# Patient Record
Sex: Female | Born: 1975 | Hispanic: No | Marital: Married | Smoking: Never smoker
Health system: Southern US, Community
[De-identification: ages and names within clinical notes are randomized; demographics above are authoritative.]

---

## 2001-06-22 ENCOUNTER — Ambulatory Visit (HOSPITAL_COMMUNITY): Admission: RE | Admit: 2001-06-22 | Discharge: 2001-06-22 | Payer: Self-pay | Admitting: *Deleted

## 2001-10-12 ENCOUNTER — Ambulatory Visit (HOSPITAL_COMMUNITY): Admission: RE | Admit: 2001-10-12 | Discharge: 2001-10-12 | Payer: Self-pay | Admitting: *Deleted

## 2001-10-15 ENCOUNTER — Inpatient Hospital Stay (HOSPITAL_COMMUNITY): Admission: AD | Admit: 2001-10-15 | Discharge: 2001-10-18 | Payer: Self-pay | Admitting: *Deleted

## 2003-08-07 ENCOUNTER — Inpatient Hospital Stay (HOSPITAL_COMMUNITY): Admission: AD | Admit: 2003-08-07 | Discharge: 2003-08-09 | Payer: Self-pay

## 2010-02-18 ENCOUNTER — Ambulatory Visit: Payer: Self-pay | Admitting: Family Medicine

## 2010-02-18 ENCOUNTER — Encounter: Payer: Self-pay | Admitting: Family Medicine

## 2010-02-18 LAB — CONVERTED CEMR LAB
Antibody Screen: NEGATIVE
Basophils Absolute: 0 10*3/uL (ref 0.0–0.1)
Basophils Relative: 0 % (ref 0–1)
Eosinophils Absolute: 0.1 10*3/uL (ref 0.0–0.7)
Eosinophils Relative: 2 % (ref 0–5)
HCT: 33.8 % — ABNORMAL LOW (ref 36.0–46.0)
Hemoglobin: 11.1 g/dL — ABNORMAL LOW (ref 12.0–15.0)
Hepatitis B Surface Ag: NEGATIVE
Lymphocytes Relative: 27 % (ref 12–46)
Lymphs Abs: 1.7 10*3/uL (ref 0.7–4.0)
MCHC: 32.8 g/dL (ref 30.0–36.0)
MCV: 81.6 fL (ref 78.0–100.0)
Monocytes Absolute: 0.4 10*3/uL (ref 0.1–1.0)
Monocytes Relative: 7 % (ref 3–12)
Neutro Abs: 3.9 10*3/uL (ref 1.7–7.7)
Neutrophils Relative %: 63 % (ref 43–77)
Platelets: 257 10*3/uL (ref 150–400)
RBC: 4.14 M/uL (ref 3.87–5.11)
RDW: 15.4 % (ref 11.5–15.5)
Rh Type: POSITIVE
Rubella: 26.3 intl units/mL — ABNORMAL HIGH
Sickle Cell Screen: NEGATIVE
WBC: 6.1 10*3/uL (ref 4.0–10.5)

## 2010-02-19 ENCOUNTER — Encounter: Payer: Self-pay | Admitting: Family Medicine

## 2010-02-25 ENCOUNTER — Encounter: Payer: Self-pay | Admitting: Family Medicine

## 2010-02-25 ENCOUNTER — Ambulatory Visit: Payer: Self-pay | Admitting: Family Medicine

## 2010-02-25 LAB — CONVERTED CEMR LAB
Chlamydia, DNA Probe: NEGATIVE
GC Probe Amp, Genital: NEGATIVE
Pap Smear: NEGATIVE

## 2010-03-28 ENCOUNTER — Ambulatory Visit: Payer: Self-pay | Admitting: Family Medicine

## 2010-04-03 ENCOUNTER — Ambulatory Visit (HOSPITAL_COMMUNITY): Admission: RE | Admit: 2010-04-03 | Discharge: 2010-04-03 | Payer: Self-pay | Admitting: *Deleted

## 2010-04-03 ENCOUNTER — Encounter: Payer: Self-pay | Admitting: Family Medicine

## 2010-04-25 ENCOUNTER — Ambulatory Visit: Payer: Self-pay | Admitting: Family Medicine

## 2010-05-22 ENCOUNTER — Encounter: Payer: Self-pay | Admitting: Family Medicine

## 2010-05-23 ENCOUNTER — Ambulatory Visit: Payer: Self-pay | Admitting: Family Medicine

## 2010-06-21 ENCOUNTER — Encounter: Payer: Self-pay | Admitting: Family Medicine

## 2010-06-21 ENCOUNTER — Ambulatory Visit: Payer: Self-pay | Admitting: Family Medicine

## 2010-06-21 LAB — CONVERTED CEMR LAB
HCT: 33.1 % — ABNORMAL LOW (ref 36.0–46.0)
Hemoglobin: 11.2 g/dL — ABNORMAL LOW (ref 12.0–15.0)
MCHC: 33.8 g/dL (ref 30.0–36.0)
MCV: 93.5 fL (ref 78.0–100.0)
Platelets: 164 10*3/uL (ref 150–400)
RBC: 3.54 M/uL — ABNORMAL LOW (ref 3.87–5.11)
RDW: 13.6 % (ref 11.5–15.5)
WBC: 6.7 10*3/uL (ref 4.0–10.5)

## 2010-06-25 ENCOUNTER — Encounter: Payer: Self-pay | Admitting: Family Medicine

## 2010-07-01 ENCOUNTER — Ambulatory Visit: Payer: Self-pay | Admitting: Family Medicine

## 2010-07-01 ENCOUNTER — Encounter: Payer: Self-pay | Admitting: Family Medicine

## 2010-07-05 ENCOUNTER — Ambulatory Visit: Payer: Self-pay | Admitting: Family Medicine

## 2010-07-19 ENCOUNTER — Ambulatory Visit: Payer: Self-pay | Admitting: Family Medicine

## 2010-08-05 ENCOUNTER — Ambulatory Visit: Payer: Self-pay | Admitting: Family Medicine

## 2010-08-13 ENCOUNTER — Ambulatory Visit: Payer: Self-pay | Admitting: Family Medicine

## 2010-08-20 ENCOUNTER — Encounter: Payer: Self-pay | Admitting: Family Medicine

## 2010-08-20 ENCOUNTER — Ambulatory Visit: Payer: Self-pay | Admitting: Family Medicine

## 2010-08-29 ENCOUNTER — Ambulatory Visit: Payer: Self-pay | Admitting: Family Medicine

## 2010-09-05 ENCOUNTER — Ambulatory Visit: Payer: Self-pay | Admitting: Family Medicine

## 2010-09-07 ENCOUNTER — Inpatient Hospital Stay (HOSPITAL_COMMUNITY): Admission: AD | Admit: 2010-09-07 | Discharge: 2010-09-07 | Payer: Self-pay | Admitting: Obstetrics & Gynecology

## 2010-09-09 ENCOUNTER — Ambulatory Visit: Payer: Self-pay | Admitting: Obstetrics & Gynecology

## 2010-09-09 ENCOUNTER — Inpatient Hospital Stay (HOSPITAL_COMMUNITY): Admission: AD | Admit: 2010-09-09 | Discharge: 2010-09-11 | Payer: Self-pay | Admitting: Obstetrics & Gynecology

## 2011-01-21 NOTE — Assessment & Plan Note (Signed)
Summary: OB F/U [facility]   Vital Signs:  Patient profile:   35 year old female Height:      59 inches Weight:      129.9 pounds Temp:     98.2 degrees F oral Pulse rate:   87 / minute BP sitting:   111 / 72  Vitals Entered By: Tessie Fass CMA (March 28, 2010 1:37 PM) CC: OB visit Pain Assessment Patient in pain? no        CC:  OB visit.  Habits & Providers  Alcohol-Tobacco-Diet     Tobacco Status: never     Cigarette Packs/Day: n/a  Current Medications (verified): 1)  Prenatal/folic Acid  Tabs (Prenatal Vit-Fe Fumarate-Fa) .... Take One Daily  Allergies (verified): No Known Drug Allergies  Review of Systems  The patient denies anorexia, fever, weight loss, chest pain, abdominal pain, and severe indigestion/heartburn.     Impression & Recommendations:  Problem # 1:  SUPERVISION OF OTHER NORMAL PREGNANCY (ICD-V22.1) Assessment Unchanged OB here at 16 weeks.  G3P2. no more vomiting. Pt complaining of some seasonal allergies.  Advised benedryl and claritin (preg class B).  Scheduled anatomy scan. discussed genetic screen.  pt has decided not to get this.  rtc in 4 weeks.    Apos/neg, Hep BSAg neg, RPR neg, Rubella Immune,  HIV neg, Hgb 11.1,  GC/C neg/neg, Orders: Ultrasound (Ultrasound) Other OB visit- [facility] (OBCK)  Complete Medication List: 1)  Prenatal/folic Acid Tabs (Prenatal vit-fe fumarate-fa) .... Take one daily  Prenatal Visit Concerns noted: No problems, no more N/V  Patient Instructions: 1)  Please schedule a follow-up appointment in 1 month with OB clinic 2)  If you have vaginal bleeding, contractions that are consistent, or any other concerns, please go to the MAU or the Clinic.     OB Initial Intake Information    Positive HCG by: self    Race: Other    Marital status: Married    Occupation: homemaker    Education (last grade completed): 12th grade    Number of children at home: 2    [facility] delivery[facility]    Newborn's physician:  [facility]  FOB Information    Husband/Father of baby: Rudy Jew    FOB occupation factory    Phone: [phone]    FOB Comments: lives together.  father of other 2 children  Menstrual History    LMP (date): 12/05/2009    LMP - Character: normal    Menarche: 12 years    Menses interval: 28 days    Menstrual flow 4 days    On BCP's at conception: no  Flowsheet View for Follow-up Visit    Estimated weeks of       gestation:     16 1/7    Weight:     129.9    Blood pressure:   111 / 72    Headache:     No    Nausea/vomiting:   No    Edema:     0    Vaginal bleeding:   no    Vaginal discharge:   no    Fundal height:      15    FHR:       155    Labor symptoms:   no    Taking prenatal vits?   Y    Smoking:     n/a    Next visit:     4 wk    Resident:  Kristoff Coonradt    Preceptor:     moreno-coll

## 2011-01-21 NOTE — Assessment & Plan Note (Signed)
Summary: OB/KH   Vital Signs:  Patient profile:   35 year old female Weight:      146.8 pounds Pulse rate:   82 / minute BP sitting:   107 / 67  Vitals Entered By: Garen Grams LPN (August 05, 2010 1:39 PM)  Habits & Providers  Alcohol-Tobacco-Diet     Cigarette Packs/Day: n/a  Allergies: No Known Drug Allergies   Impression & Recommendations:  Problem # 1:  SUPERVISION OF OTHER NORMAL PREGNANCY (ICD-V22.1) Assessment Unchanged  Orders: Other OB visit- [facility] (OBCK)  pt is G3P2 at 34.5 weeks.  varicella immune.  passed 3 hour GTT.  wants IUD.  does not want depo.  going to BF.  children go to [facility].  Baby's name is Kristy Turner.  came in with husband and 2 other children today. RTC 1 week, next time will send to Ob clinic, will do GBS at [facility] clinic.  gav scholarship form for mirena/  Complete Medication List: 1)  Prenatal/folic Acid Tabs (Prenatal vit-fe fumarate-fa) .... Take one daily  Patient Instructions: 1)  regrese en una semana 2)  Si usted tiene un sangrado vaginal, las contracciones 4x/hour, o preocupaciones, por favor vaya a la hospital o la clnica. Dos veces al da evaluar si usted cree que el beb se mueve lo suficiente. Si no, entonces hacer recuento de patadas. Si se llega a cinco movimientos del beb dentro de una hora se puede detener. Si usted tiene menos de [employer], seguir contando con una hora ms. Si usted no recibe 10 movimientos en 2 horas, usted debe ir a la MAU o llame a la clnica   OB Initial Intake Information    Positive HCG by: self    Race: Other    Marital status: Married    Occupation: homemaker    Education (last grade completed): 12th grade    Number of children at home: 2    [facility] delivery[facility]    Newborn's physician: [facility]  FOB Information    Husband/Father of baby: Kristy Turner    FOB occupation factory    Phone: [phone]    FOB Comments: lives together.  father of other 2 children  Menstrual  History    LMP (date): 12/05/2009    LMP - Character: normal    Menarche: 12 years    Menses interval: 28 days    Menstrual flow 4 days    On BCP's at conception: no   Flowsheet View for Follow-up Visit    Estimated weeks of       gestation:     34 5/7    Weight:     146.8    Blood pressure:   107 / 67    Headache:     No    Nausea/vomiting:   No    Edema:     0    Vaginal bleeding:   no    Vaginal discharge:   no    Fundal height:      35    FHR:       135    Fetal activity:     yes    Labor symptoms:   no    Taking prenatal vits?   Y    Smoking:     n/a    Next visit:     1 wk    Resident:     Hulen Luster    Preceptor:     Mauricio Po  Prenatal Visit Concerns noted: none

## 2011-01-21 NOTE — Assessment & Plan Note (Signed)
Summary: OB 21 weeks   Vital Signs:  Patient profile:   35 year old female Weight:      133.3 pounds Pulse rate:   86 / minute BP sitting:   114 / 76  Vitals Entered By: Gladstone Pih (Apr 25, 2010 9:15 AM) CC: OB 20 1/7 Is Patient Diabetic? No Pain Assessment Patient in pain? no        CC:  OB 20 1/7.  Habits & Providers  Alcohol-Tobacco-Diet     Tobacco Status: never     Cigarette Packs/Day: n/a  Current Medications (verified): 1)  Prenatal/folic Acid  Tabs (Prenatal Vit-Fe Fumarate-Fa) .... Take One Daily  Allergies (verified): No Known Drug Allergies  Physical Exam  Extremities:  Small varices on legs   Impression & Recommendations:  Problem # 1:  SUPERVISION OF OTHER NORMAL PREGNANCY (ICD-V22.1)  Pt doing well.  Reviewed normal changes of pregnancy, including varicose veins.  May use compression stockings if she would like to.  Reviewed normal ultrasound results.  F/u 4 weeks Needs varicella vaccine postpartum.  Orders: Other OB visit- [facility] (OBCK)  Complete Medication List: 1)  Prenatal/folic Acid Tabs (Prenatal vit-fe fumarate-fa) .... Take one daily  Patient Instructions: 1)  Todo parece bien! 2)  La fecha de su parto es mas o menos Septiembre 21 3)  En caso que tiene [address], sangre de su vagina, o mucho flujo o otro preguntas, puede ir a l[facility] o puede Sports administrator. Prescriptions: PRENATAL/FOLIC ACID  TABS (PRENATAL VIT-FE FUMARATE-FA) take one daily  #100 x 3   Entered and Authorized by:   Sarah Swaziland MD   Signed by:   Sarah Swaziland MD on 04/25/2010   Method used:   Print then Give to Patient   RxID:   [id number]   Prenatal Visit Concerns noted: Noted varicose veins on legs.  Had with previous pregnancies, but more today.  No pain or itching. EDC Confirmation: Ultrasound Dating Information:    First U/S on 04/03/2010   Gest age: 18 and 1/7   EDC: 09/03/2010.   Flowsheet View for Follow-up Visit    Estimated  weeks of       gestation:     20 1/7    Weight:     133.3    Blood pressure:   114 / 76    Headache:     few    Nausea/vomiting:   No    Edema:     0    Vaginal bleeding:   no    Vaginal discharge:   no    Fundal height:      20    FHR:       140s    Fetal activity:     yes    Labor symptoms:   no    Taking prenatal vits?   Y    Smoking:     n/a    Next visit:     4 wk

## 2011-01-21 NOTE — Assessment & Plan Note (Signed)
Summary: OB/KH   Vital Signs:  Patient profile:   35 year old female Weight:      148.4 pounds Temp:     98.2 degrees F oral Pulse rate:   85 / minute Pulse rhythm:   regular BP sitting:   128 / 75  (left arm) Cuff size:   regular  Vitals Entered By: Loralee Pacas CMA (August 29, 2010 9:54 AM)  Habits & Providers  Alcohol-Tobacco-Diet     Cigarette Packs/Day: n/a  Allergies: No Known Drug Allergies   Impression & Recommendations:  Problem # 1:  SUPERVISION OF OTHER NORMAL PREGNANCY (ICD-V22.1)  Patient seen in OB clinic.  Doing well, no concerns.  Visit conducted in Spanish.  Denies HA, change in discharge, or contractions.  Feels ample fetal movement.  Discussed prior deliveries and confirmed dating of this pregnancy by [place] c/w LMP.  Plans to breastfeed.  Discussed kick counts and prenatal vitamins.  For followup in 1 week with primary physician.   Orders: Other OB visit- [facility] (OBCK)  Complete Medication List: 1)  Prenatal/folic Acid Tabs (Prenatal vit-fe fumarate-fa) .... Take one daily  Patient Instructions: 1)  Fue un placer verle hoy.  Todo parece estar bien con su bebe.  2)  Siga tomando las vitaminas antenatales.  Me alegro que quiere alimentar a su hijo con la [place].  3)  Por favor haga una cita con la Dra. Spiegel en [employer].  4)  OB FOLLOW UP WITH DR Hulen Luster IN 1 WEEK    Flowsheet View for Follow-up Visit    Estimated weeks of       gestation:     38 1/7    Weight:     148.4    Blood pressure:   128 / 75    Headache:     No    Nausea/vomiting:   No    Edema:     0    Vaginal bleeding:   no    Vaginal discharge:   no    Fundal height:      38    FHR:       125    Fetal activity:     yes    Labor symptoms:   no    Fetal position:     vertex    Taking prenatal vits?   Y    Smoking:     n/a    Next visit:     1 wk    Preceptor:     Mauricio Po    Comment:     plans to breastfeed    Flowsheet View for Follow-up Visit    Estimated weeks  of       gestation:     38 1/7    Weight:     148.4    Blood pressure:   128 / 75    Hx headache?     No    Nausea/vomiting?   No    Edema?     0    Bleeding?     no    Leakage/discharge?   no    Fetal activity:       yes    Labor symptoms?   no    Fundal height:      38    FHR:       125    Fetal position:      vertex    Taking Vitamins?   Y    Smoking  PPD:   n/a    Comment:     plans to breastfeed    Next visit:     1 wk    Preceptor:     Mauricio Po

## 2011-01-21 NOTE — Assessment & Plan Note (Signed)
Summary: NOB/AAM/DSL   Vital Signs:  Patient profile:   35 year old female LMP:     12/05/2009 Height:      59 inches Weight:      126 pounds Pulse rate:   82 / minute BP sitting:   111 / 71  Vitals Entered By: Jone Baseman CMA (February 25, 2010 3:24 PM) CC: NOB LMP (date): 12/05/2009 EDC by LMP==> 09/11/2010 EDC 09/11/2010 LMP - Character: normal LMP - Reliable? Yes Menarche (age onset years): 12   Menses interval (days): 28 Menstrual flow (days): 4 On BCP's at conception: no Enter LMP: 12/05/2009   CC:  NOB.  History of Present Illness: pt here for NOB.  no current complaints.  has been vomitting some but able to hold down food and folic acid  Habits & Providers  Alcohol-Tobacco-Diet     Alcohol drinks/day: 0     Tobacco Status: never     Cigarette Packs/Day: n/a  Current Medications (verified): 1)  Prenatal/folic Acid  Tabs (Prenatal Vit-Fe Fumarate-Fa) .... Take One Daily  Allergies (verified): No Known Drug Allergies  Past History:  Past Medical History: G3P2  2 NSVDs at [facility], full term  Social History: Education:  12th grade Hepatitis Risk:  no Smoking Status:  never Packs/Day:  n/a  Physical Exam  General:  alert, well-developed, well-nourished, and well-hydrated.   Head:  normocephalic and atraumatic.   Eyes:  vision grossly intact, pupils equal, pupils round, and pupils reactive to light.   Ears:  R ear normal and L ear normal.   Nose:  no external deformity.   Mouth:  good dentition and pharynx pink and moist.   Neck:  supple, full ROM, and no masses.   Lungs:  normal respiratory effort and normal breath sounds.   Heart:  normal rate and regular rhythm.   Abdomen:  soft, non-tender, and normal bowel sounds.   Genitalia:  Pelvic Exam:        External: normal female genitalia without lesions or masses        Vagina: normal without lesions or masses        Cervix: normal without lesions or masses        Adnexa: normal bimanual exam without  masses or fullness        Uterus: normal by palpation size correlates with dates of about 11 weeks        Pap smear: performed   Impression & Recommendations:  Problem # 1:  SUPERVISION OF OTHER NORMAL PREGNANCY (ICD-V22.1) Assessment New NOB here at 11 weeks.  G3P2.  discussed PNV, prescribed them.  Pt complaining of some seasonal allergies.  Advised benedryl and claritin (preg class B).  Pt will wait for anatomy scan since sure of date of LMP.  discussed genetic screen.  pt will discuss with husband and decide.  rtc in 4 weeks.   Orders: GC/Chlamydia-[facility] ([id number]) Pap Smear-[facility] ([id number]) Other OB visit- [facility] (OBCK)  Complete Medication List: 1)  Prenatal/folic Acid Tabs (Prenatal vit-fe fumarate-fa) .... Take one daily  Patient Instructions: 1)  It was nice to meet you! 2)  please come back in 1 month 3)  Call our office if you would like to schedule an early genetic screen, or if you would like the later screen, we can schedule it at the next appt. 4)  Claritin and benedryl are ok to take for allergies. 5)  If you have bleeding or cramping more than slight spotting, call the office or go to  the women's hospital 6)  Fue un placer conocerte! 7)   por favor regrese en un mes 8)   Llame a nuestra oficina si usted quisiera programar una pantalla gentica temprana, o si desea que la pantalla despus, podemos programar para la prxima cita. 9)   Claritin y Benadryl se puede tomar para las [place]. 10)   Si usted tiene sangrado o calambres ms ligero [employer], llame a la oficina o vaya al hospital de las mujeres Prescriptions: PRENATAL/FOLIC ACID  TABS (PRENATAL VIT-FE FUMARATE-FA) take one daily  #30 x 8   Entered and Authorized by:   Ellery Plunk MD   Signed by:   Ellery Plunk MD on 02/25/2010   Method used:   Electronically to        [facility] [address] [phone]* (retail)       [address] [place], [place]  [address]       Ph: [phone]       Fax:  [phone]   RxID:   [id number]   Prenatal Visit    FOB name: Rudy Jew Concerns noted: no bleeding no discharge, no cramping EDC Confirmation:    New working [facility]: 09/11/2010    LMP reliable? Yes    Last menses onset (LMP) date: 12/05/2009    EDC by LMP: 09/11/2010   Flowsheet View for Follow-up Visit    Estimated weeks of       gestation:     11 5/7    Weight:     126    Blood pressure:   111 / 71    Headache:     No    Nausea/vomiting:   vom1/d    Edema:     0    Vaginal bleeding:   no    Vaginal discharge:   no    Fundal height:      unable to determine    FHR:       140    Labor symptoms:   no    Fetal position:     N/A    Cx Dilation:     0    Cx Effacement:   0%    Cx Station:     high    Taking prenatal vits?   Y    Smoking:     n/a    Next visit:     4 wk    Resident:     Roza Creamer    Preceptor:     hensel   OB Initial Intake Information    Positive HCG by: self    Race: Other    Marital status: Married    Occupation: homemaker    Education (last grade completed): 12th grade    Number of children at home: 2    [facility] delivery[facility]    Newborn's physician: [facility]  FOB Information    Husband/Father of baby: Rudy Jew    FOB occupation factory    Phone: [phone]    FOB Comments: lives together.  father of other 2 children  Menstrual History    LMP (date): 12/05/2009    EDC by LMP: 09/11/2010    Best Working EDC: 09/11/2010    LMP - Character: normal    LMP - Reliable? : Yes    Menarche: 12 years    Menses interval: 28 days    Menstrual flow 4 days    On BCP's at conception: no    Pre  Pregnancy Weight: 135 lbs.    Symptoms since LMP: nausea, vomiting  Prenatal Visit    FOB name: Rudy Jew [facility] Confirmation:    New working [facility]: 09/11/2010    LMP reliable? Yes    Last menses onset (LMP) date: 12/05/2009    EDC by LMP: 09/11/2010   Past Pregnancy History    Gravida:     3    Term Births:     2    Living  Children:   2    Para:       2  Pregnancy # 1    Delivery date:     10/16/2001    Weeks Gestation:   41-42    Preterm labor:     no    Delivery type:     NSVD    Hours of labor:     12    Anesthesia type:     epidural    Delivery location:     [facility]    Infant Sex:     Female    Birth weight:     8.5    Name:     Percell Belt  Pregnancy # 2    Delivery date:     08/07/2003    Weeks Gestation:   40    Preterm labor:     no    Delivery type:     NSVD    Hours of labor:     6    Anesthesia type:     nothing    Delivery location:     [facility]    Infant Sex:     Female    Birth weight:     8    Name:     Gissele  Pregnancy # 3    Comments:     current   Genetic History    Father of baby:   Jesus Jayme Cloud     Thalassemia:     mother: no   father: no    Neural tube defect:   mother: no   father: no    Down's Syndrome:   mother: no   father: no    Tay-Sachs:     mother: no   father: no    Sickle Cell Dz/Trait:   mother: no   father: no    Hemophilia:     mother: no   father: no    Muscular Dystrophy:   mother: no   father: no    Cystic Fibrosis:   mother: no   father: no    Huntington's Dz:   mother: no   father: no    Mental Retardation:   mother: no   father: no    Fragile X:     mother: no   father: no    Other Genetic or       Chromosomal Dz:   mother: no   father: no    Child with other       birth defect:     mother: no   father: no    > 3 spont. abortions:   mother: no    Hx of stillbirth:     mother: no  Genetic Counseling:    pt will discuss with husband to decide.  Infection Risk History    Infection Risk History Reviewed:    High Risk Hepatitis B: no    Immunized against Hepatitis B: yes    Exposure to TB: no    Patient with  history of Genital Herpes: no    Sexual partner with history of Genital Herpes: no    History of STD (GC, Chlamydia, Syphilis, HPV): no    Rash, Viral, or Febrile Illness since LMP: no    Exposure to Cat Litter: no    Chicken Pox Immune Status:  Non-immune    History of Parvovirus (Fifth Disease): no    Occupational Exposure to Children: none  Environmental Exposures    Environmental Exposures Reviewed:    Xray Exposure since LMP: no    Chemical or other exposure: no    Medication, drug, or alcohol use since LMP: no

## 2011-01-21 NOTE — Miscellaneous (Signed)
Summary: immune to varicella  Clinical Lists Changes  Problems: Removed problem of * VARICELLA NON-IMMUNE    pt is immune to varicella per titer  Ellery Plunk MD  June 25, 2010 3:26 PM

## 2011-01-21 NOTE — Miscellaneous (Signed)
  Clinical Lists Changes 

## 2011-01-21 NOTE — Assessment & Plan Note (Signed)
Summary: OB visit/spiegel pt/pcp not avail/eo   Vital Signs:  Patient profile:   35 year old female Weight:      147.6 pounds BP sitting:   110 / 75  Habits & Providers  Alcohol-Tobacco-Diet     Cigarette Packs/Day: n/a  Allergies: No Known Drug Allergies   Impression & Recommendations:  Problem # 1:  SUPERVISION OF OTHER NORMAL PREGNANCY (ICD-V22.1) Assessment Comment Only  No problems today.  GBS negative. Follow up with PCP next week.   pt is G3P2 at 39.1  weeks.  varicella immune.  passed 3 hour GTT.  wants IUD.  does not want depo.  going to BF.  children go to [employer] [facility] [employer] [facility].  Baby's name is Kristy Turner.  came in with husband today. RTC 1 week    Orders: Other OB visit- [facility] (OBCK)  Complete Medication List: 1)  Prenatal/folic Acid Tabs (Prenatal vit-fe fumarate-fa) .... Take one daily  Patient Instructions: 1)  Fue un placer verle hoy.  Todo parece estar bien con su bebe.  2)  Siga tomando las vitaminas antenatales.  Me alegro que quiere alimentar a su hijo con la [place].  3)  Por favor haga una cita con la Dra. Spiegel en [employer].  4)   FOLLOW UP WITH DR SPIEGEL IN 1 WEEK   OB Initial Intake Information    Positive HCG by: self    Race: Other    Marital status: Married    Occupation: homemaker    Education (last grade completed): 12th grade    Number of children at home: 2    [facility]ospital [facility] delivery: [facility]    Newborn's physician: [facility]  FOB Information    Husband/Father of baby: Rudy Jew    FOB occupation factory    Phone: [phone]    FOB Comments: lives together.  father of other 2 children  Menstrual History    LMP (date): 12/05/2009    LMP - Character: normal    Menarche: 12 years    Menses interval: 28 days    Menstrual flow 4 days    On BCP's at conception: no   Flowsheet View for Follow-up Visit    Estimated weeks of       gestation:     39 1/7    Weight:     147.6    Blood pressure:   110 / 75    Headache:     No  Nausea/vomiting:   No    Edema:     0    Vaginal bleeding:   no    Vaginal discharge:   no    Fundal height:      39    FHR:       135    Fetal activity:     yes    Labor symptoms:   no    Fetal position:     vertex by Thayer Ohm    Taking prenatal vits?   Y    Smoking:     n/a    Next visit:     1 wk    Resident:     Wallene Huh    Preceptor:     sent to [facility]

## 2011-01-21 NOTE — Assessment & Plan Note (Signed)
Summary: ob visit/eo   Vital Signs:  Patient profile:   35 year old female Height:      59 inches Weight:      147.3 pounds Pulse rate:   73 / minute BP sitting:   115 / 75  Vitals Entered By: Garen Grams LPN (August 13, 2010 2:01 PM)  Habits & Providers  Alcohol-Tobacco-Diet     Cigarette Packs/Day: n/a  Allergies: No Known Drug Allergies   Impression & Recommendations:  Problem # 1:  SUPERVISION OF OTHER NORMAL PREGNANCY (ICD-V22.1) Assessment Unchanged no problems today.  GBS deferred to [facility] clinic as discussed last week.   Orders: Other OB visit- [facility] (OBCK)  Orders: Other OB visit- [facility] (OBCK)  pt is G3P2 at 35.6  weeks.  varicella immune.  passed 3 hour GTT.  wants IUD.  does not want depo.  going to BF.  children go to [facility].  Baby's name is [name].  came in with husband and 2 other children today. RTC 1 week  gave scholarship form for mirena/  Complete Medication List: 1)  Prenatal/folic Acid Tabs (Prenatal vit-fe fumarate-fa) .... Take one daily  Patient Instructions: 1)  return in one week for OB clinic 2)  Si usted tiene un sangrado vaginal, las contracciones 4x/hour, o preocupaciones, por favor vaya a la hospital de mujeres o la clnica. Dos veces al da evaluar si usted cree que el beb se mueve lo suficiente. Si no, entonces hacer recuento de patadas. Si se llega a cinco movimientos del beb dentro de una hora se puede detener. Si usted tiene menos de [employer], seguir contando con una hora ms. 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  father of other 2 children  Menstrual History    LMP (date): 12/05/2009    LMP - Character: normal    Menarche: 12 years    Menses interval: 28 days    Menstrual flow 4 days    On BCP's at conception: no   Flowsheet View for Follow-up Visit    Estimated weeks of       gestation:     35 6/7    Weight:     147.3    Blood pressure:   115 / 75    Headache:     No    Nausea/vomiting:   No    Edema:     0    Vaginal bleeding:   no    Vaginal discharge:   no    Fundal height:      36    FHR:       140    Fetal activity:     yes    Labor symptoms:   no    Taking prenatal vits?   Y    Smoking:     n/a    Next visit:     1 wk    Resident:  Geraldine Sandberg    Preceptor:     Jennette Kettle

## 2011-01-21 NOTE — Assessment & Plan Note (Signed)
Summary: 8:15 OB F/U PER DR SPEIGEL/[facility]   Vital Signs:  Patient profile:   35 year old female Weight:      142 pounds BP sitting:   114 / 72  Vitals Entered By: Jimmy Footman, CMA (July 19, 2010 8:36 AM) CC: ob f/u Is Patient Diabetic? No Pain Assessment Patient in pain? no        CC:  ob f/u.  Habits & Providers  Alcohol-Tobacco-Diet     Cigarette Packs/Day: n/a  Allergies: No Known Drug Allergies   Impression & Recommendations:  Problem # 1:  SUPERVISION OF OTHER NORMAL PREGNANCY (ICD-V22.1) Assessment Unchanged  pt is G3P2 at 32.2 weeks.  varicella immune.  passed 3 hour GTT.  wants IUD.  does not want depo.  going to BF.  children go to [facility].  Baby's name is Kristy Turner.  came in with husband and 2 other children today. RTC 2 weeks Orders: Other OB visit- [facility] ([facility]K)  Complete Medication List: 1)  Prenatal/folic Acid Tabs (Prenatal vit-fe fumarate-fa) .... Take one daily  Patient Instructions: 1)  regrese a la clinica in 2 semanas 2)  Si usted tiene un sangrado vaginal, las contracciones 4x/hour, o preocupaciones, por favor vaya a la hospital [facility] o la [place].  Prenatal Visit Concerns noted: none today   Flowsheet View for Follow-up Visit    Estimated weeks of       gestation:     32 2/7    Weight:     142    Blood pressure:   114 / 72    Hx headache?     No    Nausea/vomiting?   No    Edema?     0    Bleeding?     no    Leakage/discharge?   no    Fetal activity:       yes    Labor symptoms?   no    Fundal height:      32    FHR:       135    Taking Vitamins?   Y    Smoking PPD:   n/a    Next visit:     2 wk    Resident:     Hulen Luster    Preceptor:     Deirdre Priest

## 2011-01-21 NOTE — Assessment & Plan Note (Signed)
Summary: ob visit/eo   Vital Signs:  Patient profile:   35 year old female Weight:      140.6 pounds Pulse rate:   111 / minute BP sitting:   106 / 75  Vitals Entered By: Garen Grams LPN (July 05, 2010 2:57 PM)  Habits & Providers  Alcohol-Tobacco-Diet     Cigarette Packs/Day: n/a  Allergies: No Known Drug Allergies   Impression & Recommendations:  Problem # 1:  SUPERVISION OF OTHER NORMAL PREGNANCY (ICD-V22.1) Assessment Unchanged pt is G3P2 at 30.2 weeks.  varicella immune.  passed 3 hour GTT.  wants IUD.  does not want depo.  gave form for scholarship today.  going to BF.  children go to [facility].  Baby's name is [name]. Orders: Other OB visit- [facility] ([facility]K)  Complete Medication List: 1)  Prenatal/folic Acid Tabs (Prenatal vit-fe fumarate-fa) .... Take one daily  Patient Instructions: 1)  regrese a la clinica in 2 semanas 2)  Si usted tiene un sangrado vaginal, las contracciones 4x/hour, o preocupaciones, por favor vaya a la [facility] o la [place].   OB Initial Intake Information    Positive HCG by: self    Race: Other    Marital status: Married    Occupation: homemaker    Education (last grade completed): 12th grade    Number of children at home: 2    [facility]ospital of delivery: [facility]    Newborn's physician: [facility]  FOB Information    Husband/Father of baby: Rudy Jew    FOB occupation factory    Phone: [phone]    FOB Comments: lives together.  father of other 2 children  Menstrual History    LMP (date): 12/05/2009    LMP - Character: normal    Menarche: 12 years    Menses interval: 28 days    Menstrual flow 4 days    On BCP's at conception: no   Flowsheet View for Follow-up Visit    Estimated weeks of       gestation:     30 2/7    Weight:     140.6    Blood pressure:   106 / 75    Headache:     No    Nausea/vomiting:   No    Edema:     0    Vaginal bleeding:   no    Vaginal discharge:   no    Fundal height:      30  FHR:       140    Fetal activity:     yes    Labor symptoms:   few ctx    Taking prenatal vits?   Y    Smoking:     n/a    Next visit:     2 wk    Resident:     Hulen Luster    Preceptor:     Chambliss  Prenatal Visit Concerns noted: none

## 2011-01-21 NOTE — Assessment & Plan Note (Signed)
Summary: OB/KH   Vital Signs:  Patient profile:   35 year old female Weight:      147.3 pounds Pulse rate:   67 / minute BP sitting:   111 / 73  Vitals Entered By: Garen Grams LPN (August 20, 2010 2:24 PM)  Habits & Providers  Alcohol-Tobacco-Diet     Cigarette Packs/Day: n/a  Allergies: No Known Drug Allergies   Impression & Recommendations:  Problem # 1:  SUPERVISION OF OTHER NORMAL PREGNANCY (ICD-V22.1) Assessment Unchanged  no problems today.  GBS done today.  will f/u with OB clinic next week.   pt is G3P2 at 36.6  weeks.  varicella immune.  passed 3 hour GTT.  wants IUD.  does not want depo.  going to BF.  children go to [facility].  Baby's name is Kristy Turner.  came in with husband today. RTC 1 week    Complete Medication List: 1)  Prenatal/folic Acid Tabs (Prenatal vit-fe fumarate-fa) .... Take one daily  Other Orders: Grp B Probe-[facility] ([id number]) Other OB visit- [facility])  Patient Instructions: 1)  come back in one week as scheduled. 2)  regrese en 1 semana 3)  Si usted tiene un sangrado vaginal, las contracciones 4x/hour, o preocupaciones, por favor vaya a la MAU o la clnica. Dos veces al da evaluar si usted cree que el beb se mueve lo suficiente. Si no, entonces hacer recuento de patadas. Si se llega a cinco movimientos del beb dentro de una hora se puede detener. Si usted tiene menos de [place], seguir contando con una hora ms. Si usted no recibe 10 movimientos en 2 horas, usted debe ir a la MAU o llame a la clnica   OB Initial Intake Information    Positive HCG by: self    Race: Other    Marital status: Married    Occupation: homemaker    Education (last grade completed): 12th grade    Number of children at home: 2    [facility]ospital of delivery: [facility]    Newborn's physician: [facility]  FOB Information    Husband/Father of baby: Rudy Jew    FOB occupation factory    Phone: [phone]    FOB Comments: lives together.  father of  other 2 children  Menstrual History    LMP (date): 12/05/2009    LMP - Character: normal    Menarche: 12 years    Menses interval: 28 days    Menstrual flow 4 days    On BCP's at conception: no   Flowsheet View for Follow-up Visit    Estimated weeks of       gestation:     36 6/7    Weight:     147.3    Blood pressure:   111 / 73    Headache:     No    Nausea/vomiting:   No    Edema:     0    Vaginal bleeding:   no    Vaginal discharge:   no    Fundal height:      37    FHR:       140    Fetal activity:     yes    Labor symptoms:   no    Taking prenatal vits?   Y    Smoking:     n/a    Next visit:     1 wk    Resident:     Hulen Luster    Preceptor:  chambliss

## 2011-01-21 NOTE — Assessment & Plan Note (Signed)
Summary: ob/tlb   Vital Signs:  Patient profile:   35 year old female Height:      59 inches Weight:      140 pounds BP sitting:   107 / 72  Vitals Entered By: Jone Baseman CMA (June 21, 2010 1:53 PM) CC: OB   CC:  OB.  Habits & Providers  Alcohol-Tobacco-Diet     Cigarette Packs/Day: n/a  Allergies: No Known Drug Allergies   Impression & Recommendations:  Problem # 1:  SUPERVISION OF OTHER NORMAL PREGNANCY (ICD-V22.1) Assessment Unchanged 28 weeks today.  checked varicella with 28 week labs.  pt to return for 3 hour GTT.  no concerns today.  pt will discuss BC with husband and come back next time with questions. Orders: Glucose 1 hr-[facility] (82950) CBC-[facility] (14782) RPR-[facility] ([id number]) Miscellaneous Lab Charge-[facility] (78469) HIV-[facility] ([id number]) Other OB visit- [facility] (OBCK)  Complete Medication List: 1)  Prenatal/folic Acid Tabs (Prenatal vit-fe fumarate-fa) .... Take one daily  Patient Instructions: 1)  please come back in 2 weeks 2)  regrese a la clinica in 2 semanas 3)  Si usted tiene un sangrado vaginal, las contracciones 4x/hour, o preocupaciones, por favor vaya a la hospital [facility] o la clnica.   Prenatal Visit Concerns noted: no concerns today   Flowsheet View for Follow-up Visit    Estimated weeks of       gestation:     28 2/7    Weight:     140    Blood pressure:   107 / 72    Headache:     No    Nausea/vomiting:   No    Edema:     0    Vaginal bleeding:   no    Vaginal discharge:   no    Fundal height:      29    FHR:       135    Fetal activity:     yes    Taking prenatal vits?   Y    Smoking:     n/a    Next visit:     2 wk    Resident:     Hulen Luster    Preceptor:     Deirdre Priest

## 2011-01-21 NOTE — Assessment & Plan Note (Signed)
Summary: ob visit/eo   Vital Signs:  Patient profile:   35 year old female Weight:      137 pounds BP sitting:   105 / 67  Vitals Entered By: Jone Baseman CMA (May 23, 2010 2:15 PM)  Habits & Providers  Alcohol-Tobacco-Diet     Cigarette Packs/Day: n/a  Allergies: No Known Drug Allergies   Impression & Recommendations:  Problem # 1:  SUPERVISION OF OTHER NORMAL PREGNANCY (ICD-V22.1) Assessment Unchanged  will need varicella titers at 28 week labs.  Will RTC in 4 weeks.  need to discuss birth control, child birth classes at that visit. Apos/neg, Hep BSAg neg, RPR neg, Rubella Immune,  HIV neg, Hgb 11.1,  GC/C neg/neg,  Orders: Other OB visit- [facility] (OBCK)  Complete Medication List: 1)  Prenatal/folic Acid Tabs (Prenatal vit-fe fumarate-fa) .... Take one daily  Patient Instructions: 1)  If you have vaginal bleeding, contractions that are consistent, or any other concerns, please go to the MAU or the Clinic.   2)  Think about what you would like to do for contraception so we can discuss your options at some point in the future.   3)  It is wonderful that you are going to breastfeed!  THis is great for baby. 4)  See you in one month.  At that visit we will check your bloodwork again. 5)  Si tiene un sangrado vaginal, las contracciones que son coherentes, o cualquier otra inquietud, por favor vaya a la MAU o la clnica. 6)   Piense en lo que les gustara hacer para la anticoncepcin para que podamos hablar de sus opciones en algn momento en el futuro. 7)   Es [employer] que usted va a Museum/gallery exhibitions officer! Esto es muy bueno para el beb. 8)   Nos vemos en un mes. En esa consulta vamos a ver sus anlisis de [employer].  Prenatal Visit Concerns noted: no concerns today   Flowsheet View for Follow-up Visit    Estimated weeks of       gestation:     24 1/7    Weight:     137    Blood pressure:   105 / 67    Headache:     No    Nausea/vomiting:   No    Edema:     0  Vaginal bleeding:   no    Vaginal discharge:   no    Fundal height:      23.5    FHR:       140    Fetal activity:     yes    Taking prenatal vits?   Y    Smoking:     n/a    Next visit:     4 wk    Resident:     Hulen Luster    Preceptor:     Mauricio Po

## 2011-01-26 IMAGING — US US OB DETAIL+14 WK
2 series · 14 of 28 positions shown · non-contrast
Comparison: none

OBSTETRICAL ULTRASOUND:
 This ultrasound exam was performed in the [HOSPITAL] Ultrasound Department.  The OB US report was generated in the AS system, and faxed to the ordering physician.  This report is also available in [HOSPITAL]?s Access[employer] and in [REDACTED] PACS.

[Series 1: us ob detail +14 wk · 0.21mm/px · 1 of 4 slices shown (1 of 2)]
[im 4/4]
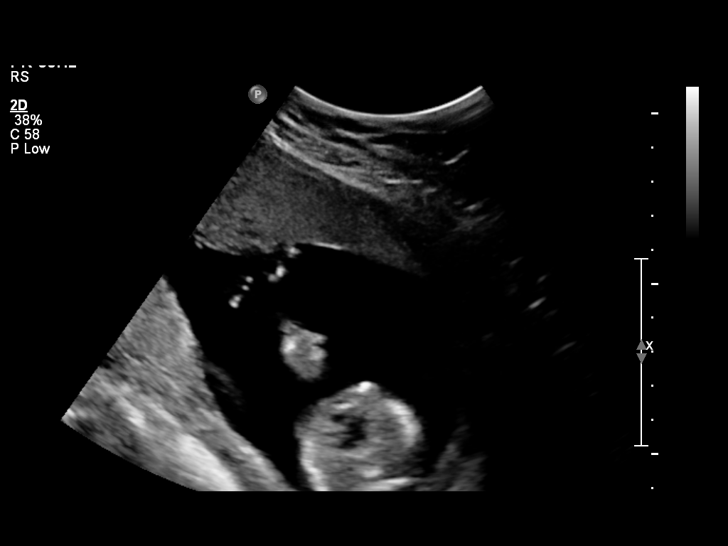

[Series 1: us ob detail +14 wk · 13 of 67 slices shown (2 of 2)]
[im 3/67]
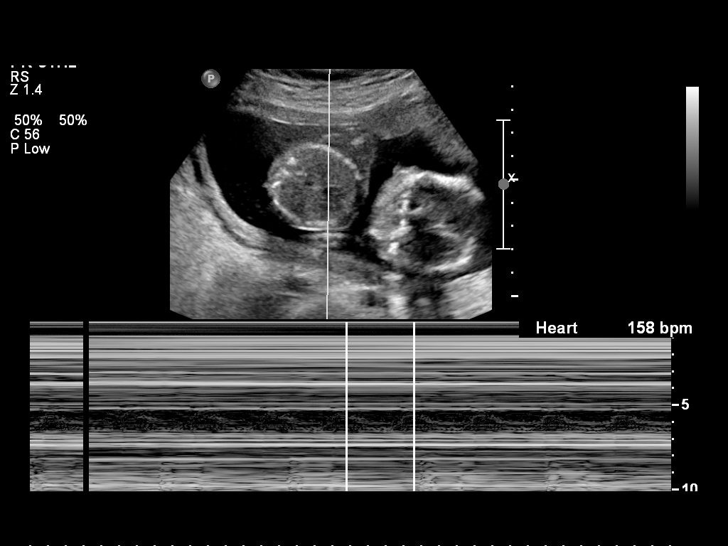
[im 8/67]
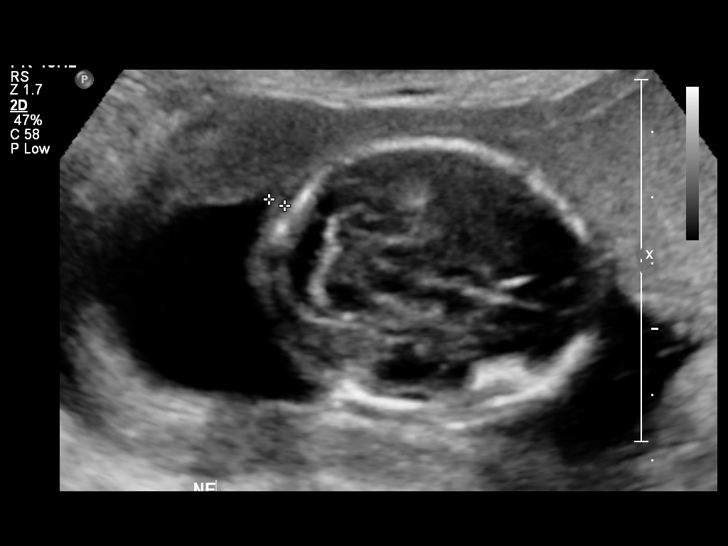
[im 14/67]
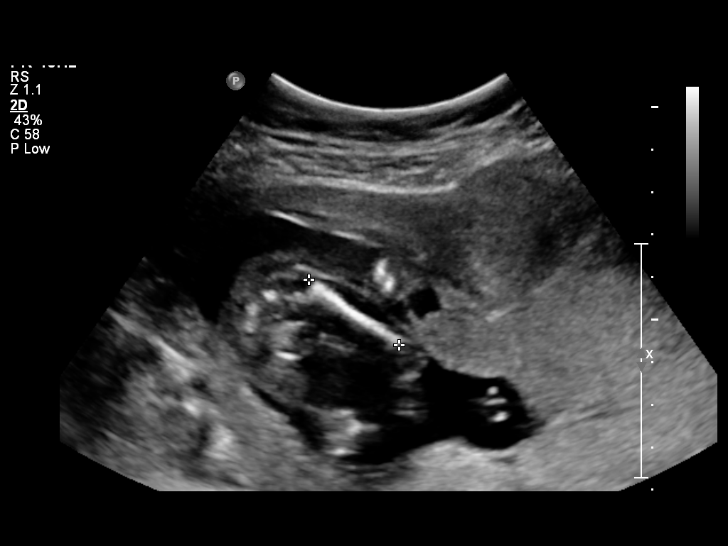
[im 19/67]
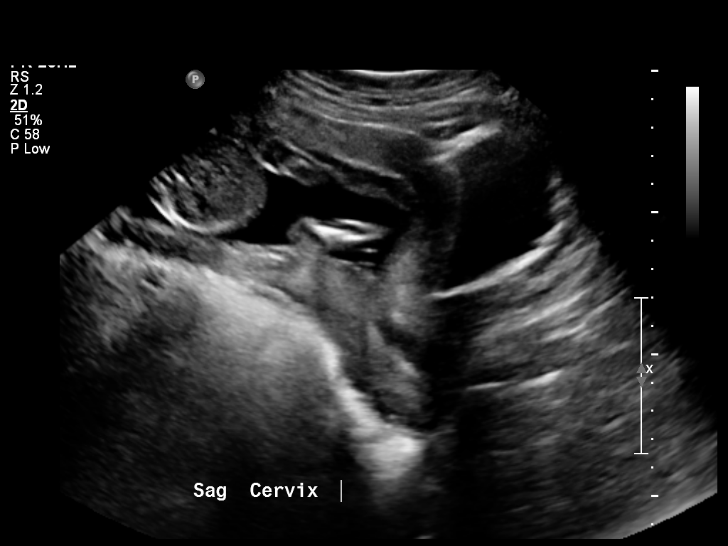
[im 24/67]
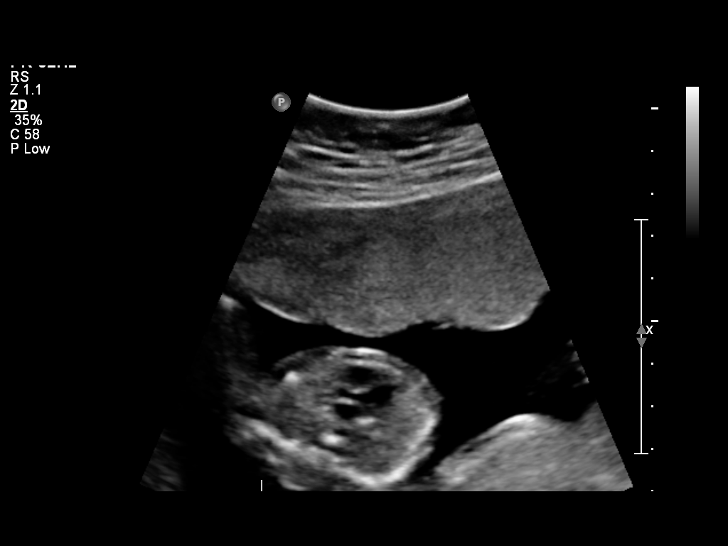
[im 30/67]
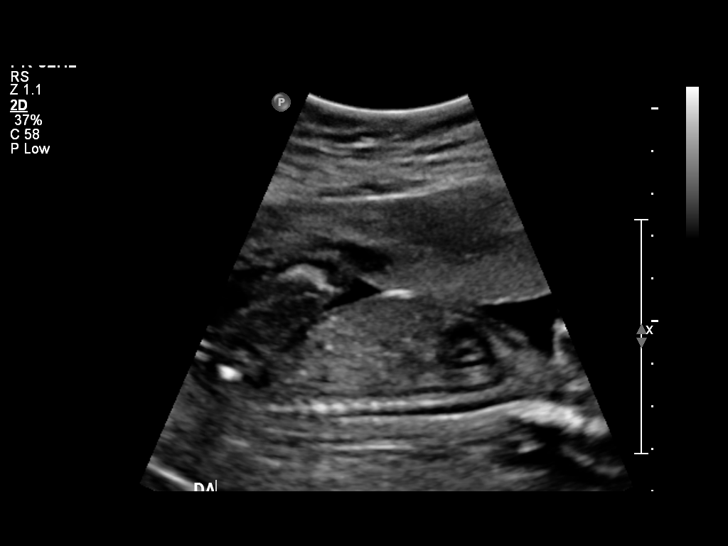
[im 35/67]
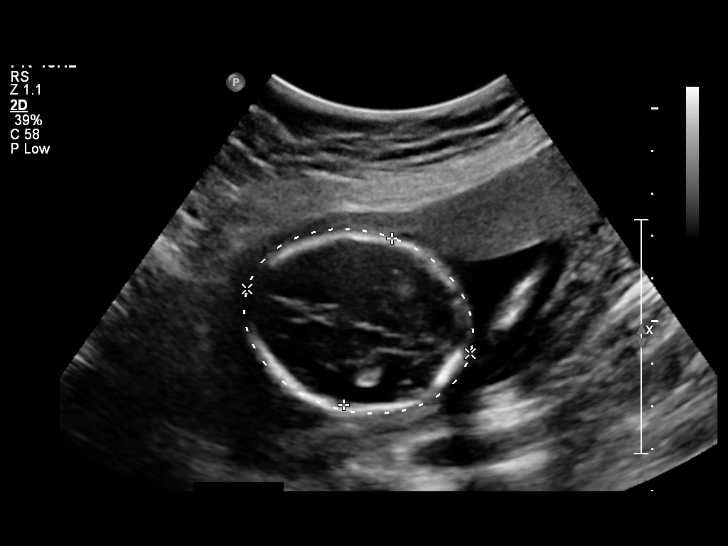
[im 40/67]
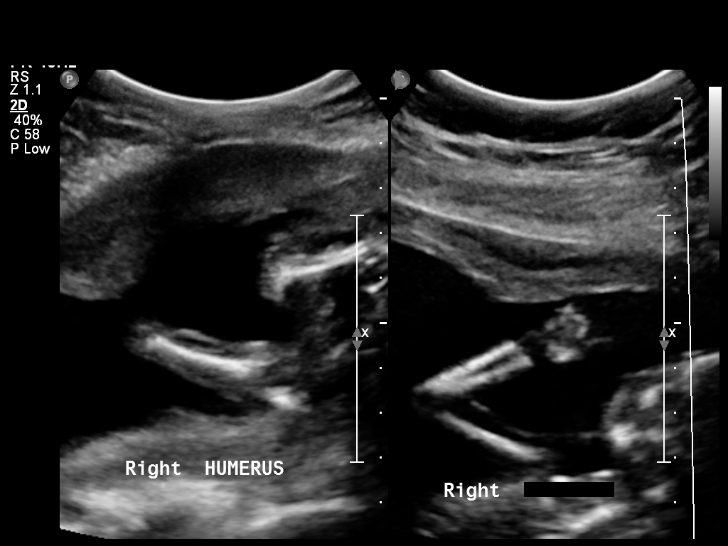
[im 45/67]
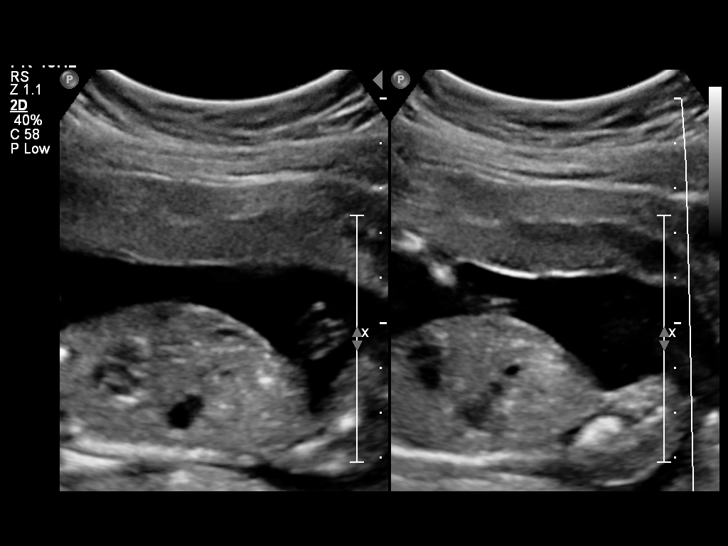
[im 51/67]
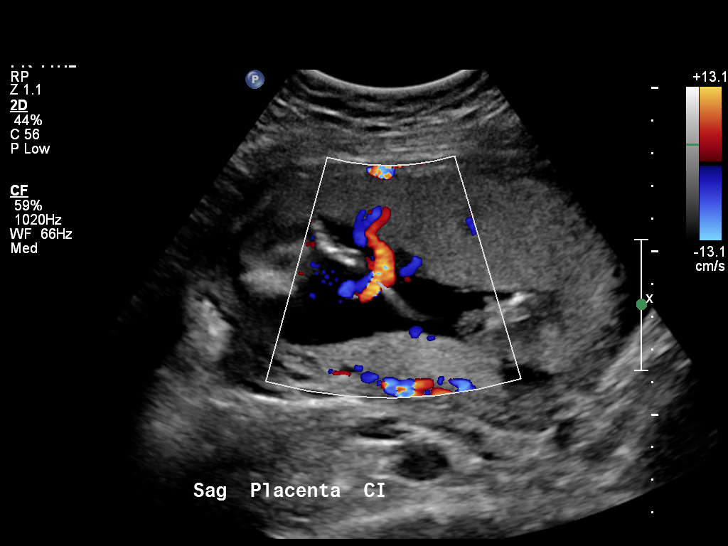
[im 56/67]
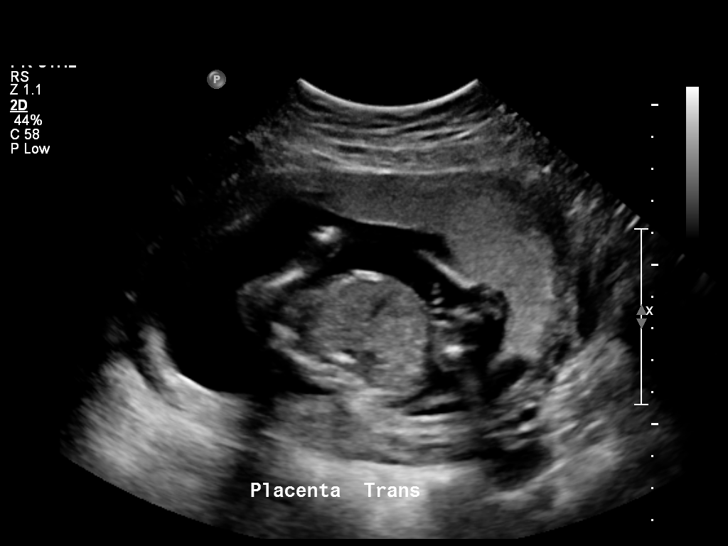
[im 61/67]
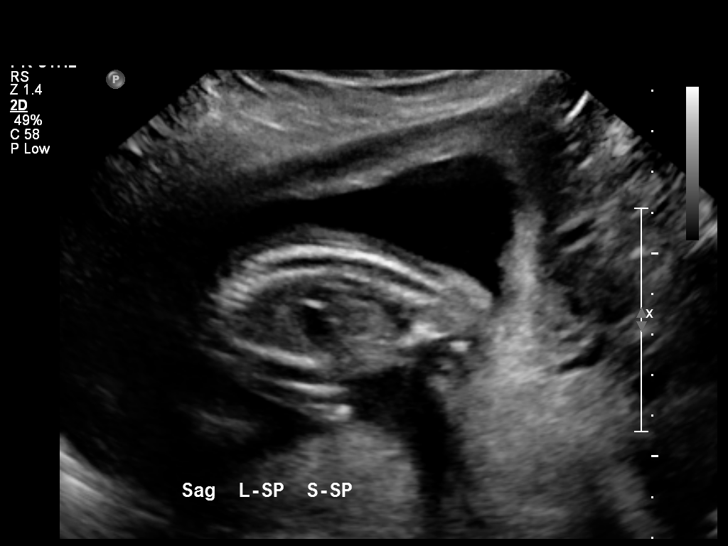
[im 67/67]
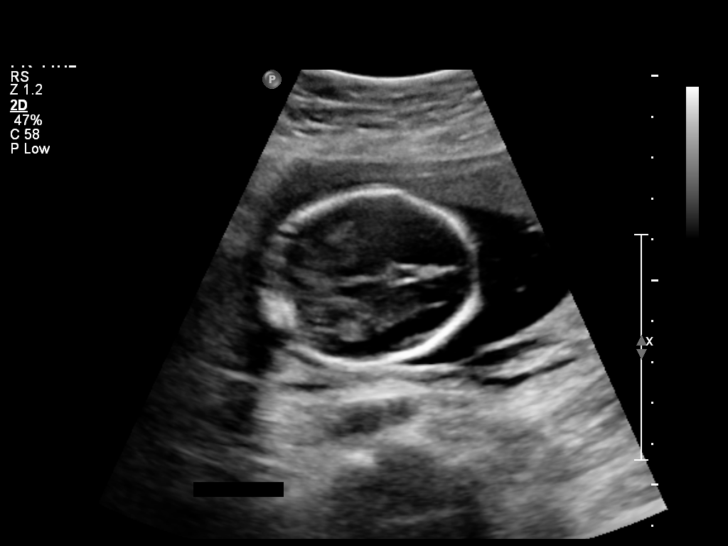

[14 of 28 positions shown; findings below may reference images not displayed]

IMPRESSION: See AS Obstetric US report.

## 2011-02-05 ENCOUNTER — Encounter: Payer: Self-pay | Admitting: *Deleted

## 2011-03-06 LAB — CBC
MCHC: 34.3 g/dL (ref 30.0–36.0)
MCV: 97.9 fL (ref 78.0–100.0)
Platelets: 185 10*3/uL (ref 150–400)
RDW: 12.5 % (ref 11.5–15.5)
WBC: 10 10*3/uL (ref 4.0–10.5)

## 2011-03-09 LAB — GLUCOSE, CAPILLARY: Glucose-Capillary: 75 mg/dL (ref 70–99)

## 2012-01-09 ENCOUNTER — Encounter: Payer: Self-pay | Admitting: Family Medicine

## 2012-01-19 ENCOUNTER — Encounter: Payer: Self-pay | Admitting: Family Medicine

## 2012-02-04 ENCOUNTER — Ambulatory Visit (INDEPENDENT_AMBULATORY_CARE_PROVIDER_SITE_OTHER): Payer: Self-pay | Admitting: Family Medicine

## 2012-02-04 ENCOUNTER — Encounter: Payer: Self-pay | Admitting: Family Medicine

## 2012-02-04 VITALS — BP 110/80 | HR 60 | Temp 98.4°F | Ht 60.0 in | Wt 120.8 lb

## 2012-02-04 DIAGNOSIS — N63 Unspecified lump in unspecified breast: Secondary | ICD-10-CM

## 2012-02-04 NOTE — Patient Instructions (Signed)
fue agradable volver a vertele enviamos para una mamografa por favor llame a nuestra oficina con cualquier pregunta

## 2012-02-04 NOTE — Progress Notes (Signed)
  Subjective:    Patient ID: Kristy Turner, female    DOB: 06-25-1976, 36 y.o.   MRN: [id number]  HPI  Pt presents today after 3 months of noticing a lump in her breast at 6 o'clock on the right.  She also has some intermittent sharp pain in that area.  She does not think it has grown.  She is still breastfeeding.  No fevers or weight loss.  No change in milk production.  Review of Systems See above    Objective:   Physical Exam  Vital signs reviewed General appearance - alert, well appearing, and in no distress and oriented to person, place, and time Breasts- bilateral breasts examined and no lump felt.  No axillary lymphadenopathy.  No skin changes.  Breasts are symmetric.        Assessment & Plan:

## 2012-02-04 NOTE — Assessment & Plan Note (Signed)
Lump not felt distinctly today, no changes in skin. However, pt has noticed a perisistent change.  Will send for imaging to further confirm.

## 2012-02-25 ENCOUNTER — Ambulatory Visit
Admission: RE | Admit: 2012-02-25 | Discharge: 2012-02-25 | Disposition: A | Discharge status: Home / Self Care | Payer: Self-pay | Source: Ambulatory Visit | Attending: Family Medicine | Admitting: Family Medicine

## 2012-02-25 ENCOUNTER — Other Ambulatory Visit: Payer: Self-pay | Admitting: Family Medicine

## 2012-10-06 ENCOUNTER — Ambulatory Visit (INDEPENDENT_AMBULATORY_CARE_PROVIDER_SITE_OTHER): Payer: Self-pay | Admitting: Emergency Medicine

## 2012-10-06 ENCOUNTER — Encounter (HOSPITAL_COMMUNITY): Payer: Self-pay | Admitting: Emergency Medicine

## 2012-10-06 VITALS — BP 110/82 | HR 95 | Temp 98.6°F | Resp 16 | Ht 60.5 in | Wt 123.0 lb

## 2012-10-06 DIAGNOSIS — F329 Major depressive disorder, single episode, unspecified: Secondary | ICD-10-CM | POA: Insufficient documentation

## 2012-10-06 DIAGNOSIS — F22 Delusional disorders: Secondary | ICD-10-CM

## 2012-10-06 DIAGNOSIS — F411 Generalized anxiety disorder: Secondary | ICD-10-CM | POA: Insufficient documentation

## 2012-10-06 DIAGNOSIS — F3289 Other specified depressive episodes: Secondary | ICD-10-CM | POA: Insufficient documentation

## 2012-10-06 LAB — CBC
Hemoglobin: 11.3 g/dL — ABNORMAL LOW (ref 12.0–15.0)
Platelets: 277 10*3/uL (ref 150–400)
RBC: 4.32 MIL/uL (ref 3.87–5.11)
WBC: 7.7 10*3/uL (ref 4.0–10.5)

## 2012-10-06 LAB — ETHANOL: Alcohol, Ethyl (B): <11 mg/dL (ref 0–11)

## 2012-10-06 LAB — COMPREHENSIVE METABOLIC PANEL
Albumin: 4 g/dL (ref 3.5–5.2)
BUN: 13 mg/dL (ref 6–23)
CO2: 23 mEq/L (ref 19–32)
Chloride: 103 mEq/L (ref 96–112)
Creatinine, Ser: 0.57 mg/dL (ref 0.50–1.10)
GFR calc Af Amer: >90 mL/min (ref >90)
Potassium: 3.2 mEq/L — ABNORMAL LOW (ref 3.5–5.1)
Sodium: 138 mEq/L (ref 135–145)
Total Protein: 7.7 g/dL (ref 6.0–8.3)

## 2012-10-06 LAB — SALICYLATE LEVEL: Salicylate Lvl: <2 mg/dL — ABNORMAL LOW (ref 2.8–20.0)

## 2012-10-06 LAB — ACETAMINOPHEN LEVEL: Acetaminophen (Tylenol), Serum: <15 ug/mL (ref 10–30)

## 2012-10-06 LAB — RAPID URINE DRUG SCREEN, HOSP PERFORMED
Benzodiazepines: NOT DETECTED
Cocaine: NOT DETECTED

## 2012-10-06 NOTE — ED Notes (Signed)
(  Per translator- brother-in-law) C/o feeling nervous for the last 2 days and not able to sleep.  Denies suicidal ideation and homicidal ideation.  Reports feeling depressed.

## 2012-10-06 NOTE — ED Notes (Signed)
Pt was wanded by security.  

## 2012-10-06 NOTE — Progress Notes (Signed)
[facility] [address], [place] [address] [phone]  Date:  10/06/2012   Name:  Kristy Turner   DOB:  August 22, 1976   MRN:  [id number]  PCP:  Priscella Mann, MD    Chief Complaint: Depression   History of Present Illness:  Kristy Turner is a 36 y.o. very pleasant female patient who presents with the following:  Patient is a 36 year old with a history of depression and paranoia. She is sad and tearful and inappropriate in the amount of worry and grief that she displays according to her brother.  She has thoughts of external reference and paranoid delusions.  Denies thoughts of self harm or harm to others.    Patient Active Problem List  Diagnosis  . Breast lump on right side at 6 o'clock position    No past medical history on file.  No past surgical history on file.  History  Substance Use Topics  . Smoking status: Never Smoker   . Smokeless tobacco: Not on file  . Alcohol Use: Not on file    No family history on file.  No Known Allergies  Medication list has been reviewed and updated.  No current outpatient prescriptions on file prior to visit.    Review of Systems:  As per HPI, otherwise negative.    Physical Examination: Filed Vitals:   10/06/12 1224  BP: 110/82  Pulse: 95  Temp: 98.6 F (37 C)  Resp: 16   Filed Vitals:   10/06/12 1224  Height: 5' 0.5" (1.537 m)  Weight: 123 lb (55.792 kg)   Body mass index is 23.63 kg/(m^2). Ideal Body Weight: Weight in (lb) to have BMI = 25: 129.9    GEN: WDWN, NAD, Non-toxic, Alert & Oriented x 3 HEENT: Atraumatic, Normocephalic.  Ears and Nose: No external deformity. EXTR: No clubbing/cyanosis/edema NEURO: Normal gait.  PSYCH: Normally interactive. Conversant. Is depressed and anxious appearing.  Calm demeanor. Not apparently hallucinating.  Slow measured hesitant speech.   Assessment and Plan: Acute psychosis vs profound depression To behavioural  health.  Carmelina Dane, MD

## 2012-10-06 NOTE — ED Notes (Signed)
Pt changed into blue paper scrubs and security called to wand pt. 

## 2012-10-07 ENCOUNTER — Emergency Department (HOSPITAL_COMMUNITY)
Admission: EM | Admit: 2012-10-07 | Discharge: 2012-10-07 | Disposition: A | Discharge status: Home / Self Care | Payer: Self-pay | Attending: Emergency Medicine | Admitting: Emergency Medicine

## 2012-10-07 DIAGNOSIS — F419 Anxiety disorder, unspecified: Secondary | ICD-10-CM

## 2012-10-07 MED ORDER — ALPRAZOLAM 0.25 MG PO TABS: 0.2500 mg | ORAL_TABLET | Freq: Four times a day (QID) | ORAL | Status: DC | PRN

## 2012-10-07 MED ORDER — ALPRAZOLAM 0.25 MG PO TABS
0.2500 mg | ORAL_TABLET | Freq: Once | ORAL | Status: AC
  Administered 2012-10-07: 0.25 mg via ORAL
  Filled 2012-10-07: qty 1

## 2012-10-07 MED ORDER — POTASSIUM CHLORIDE CRYS ER 20 MEQ PO TBCR
40.0000 meq | EXTENDED_RELEASE_TABLET | Freq: Once | ORAL | Status: AC
  Administered 2012-10-07: 40 meq via ORAL
  Filled 2012-10-07: qty 2

## 2012-10-07 NOTE — ED Notes (Signed)
Pr with family waiting for clearance to go to [facility]

## 2012-10-07 NOTE — ED Provider Notes (Signed)
History     CSN: [id number]  Arrival date & time 10/06/12  2136   First MD Initiated Contact with Patient 10/07/12 0116      Chief Complaint  Patient presents with  . Anxiety   HPI  History provided by the patient through a Spanish interpreter. Patient is a 36 year old Hispanic female who presents with complaints of depression and nervous feeling. Patient reports symptoms have seemed to worsen with episodes of feeling sad and anxious and crying episodes over the past few days. Symptoms seem to be worse after her father passed away recently. Patient does report feeling sad in the past at times but there is never persistent or this severe. Patient denies any SI or HI. She feels safe at home. Symptoms have been associated with some insomnia. She denies any hallucinations. She has not used any treatments for symptoms.    History reviewed. No pertinent past medical history.  History reviewed. No pertinent past surgical history.  No family history on file.  History  Substance Use Topics  . Smoking status: Never Smoker   . Smokeless tobacco: Not on file  . Alcohol Use: No    OB History    Grav Para Term Preterm Abortions TAB SAB Ect Mult Living                  Review of Systems  Constitutional: Negative for fever and chills.  Neurological: Negative for dizziness, light-headedness and headaches.  Psychiatric/Behavioral: Positive for dysphoric mood. Negative for suicidal ideas. The patient is nervous/anxious.     Allergies  Review of patient's allergies indicates no known allergies.  Home Medications  No current outpatient prescriptions on file.  BP 120/81  Pulse 82  Temp 98.2 F (36.8 C) (Oral)  Resp 16  SpO2 98%  LMP 09/15/2012  Physical Exam  Nursing note and vitals reviewed. Constitutional: She is oriented to person, place, and time. She appears well-developed and well-nourished. No distress.  HENT:  Head: Normocephalic.  Cardiovascular: Normal rate and  regular rhythm.   No murmur heard. Pulmonary/Chest: Effort normal and breath sounds normal. No respiratory distress. She has no rales.  Neurological: She is alert and oriented to person, place, and time.  Skin: Skin is warm and dry.  Psychiatric: Her behavior is normal. She exhibits a depressed mood. She expresses no homicidal and no suicidal ideation.    ED Course  Procedures  Results for orders placed during the hospital encounter of 10/07/12  CBC      Component Value Range   WBC 7.7  4.0 - 10.5 K/uL   RBC 4.32  3.87 - 5.11 MIL/uL   Hemoglobin 11.3 (*) 12.0 - 15.0 g/dL   HCT 40.9 (*) 81.1 - 91.4 %   MCV 78.0  78.0 - 100.0 fL   MCH 26.2  26.0 - 34.0 pg   MCHC 33.5  30.0 - 36.0 g/dL   RDW 78.2  95.6 - 21.3 %   Platelets 277  150 - 400 K/uL  COMPREHENSIVE METABOLIC PANEL      Component Value Range   Sodium 138  135 - 145 mEq/L   Potassium 3.2 (*) 3.5 - 5.1 mEq/L   Chloride 103  96 - 112 mEq/L   CO2 23  19 - 32 mEq/L   Glucose, Bld 105 (*) 70 - 99 mg/dL   BUN 13  6 - 23 mg/dL   Creatinine, Ser 0.86  0.50 - 1.10 mg/dL   Calcium 9.4  8.4 - 57.8 mg/dL  Total Protein 7.7  6.0 - 8.3 g/dL   Albumin 4.0  3.5 - 5.2 g/dL   AST 16  0 - 37 U/L   ALT 10  0 - 35 U/L   Alkaline Phosphatase 56  39 - 117 U/L   Total Bilirubin 0.2 (*) 0.3 - 1.2 mg/dL   GFR calc non Af Amer >90  >90 mL/min   GFR calc Af Amer >90  >90 mL/min  ETHANOL      Component Value Range   Alcohol, Ethyl (B) <11  0 - 11 mg/dL  SALICYLATE LEVEL      Component Value Range   Salicylate Lvl <2.0 (*) 2.8 - 20.0 mg/dL  URINE RAPID DRUG SCREEN (HOSP PERFORMED)      Component Value Range   Opiates NONE DETECTED  NONE DETECTED   Cocaine NONE DETECTED  NONE DETECTED   Benzodiazepines NONE DETECTED  NONE DETECTED   Amphetamines NONE DETECTED  NONE DETECTED   Tetrahydrocannabinol NONE DETECTED  NONE DETECTED   Barbiturates NONE DETECTED  NONE DETECTED  ACETAMINOPHEN LEVEL      Component Value Range   Acetaminophen  (Tylenol), Serum <15.0  10 - 30 ug/mL  POCT PREGNANCY, URINE      Component Value Range   Preg Test, Ur NEGATIVE  NEGATIVE        1. Anxiety and depression       MDM  1:55AM patient seen and evaluated.  Slight hypokalemia. Potassium given. Patient denies any SI or HI. I spoke with patient through interpreter as well as discussed issues with patient's family. This time patient seen harm to himself or others in stable discharge home. We'll give outpatient referral to [facility] for continuation treatment. We'll give small prescription for Xanax to help with anxiety feelings.      Angus Seller, [place] 10/07/12 [phone]

## 2012-10-07 NOTE — ED Provider Notes (Signed)
Medical screening examination/treatment/procedure(s) were performed by non-physician practitioner and as supervising physician I was immediately available for consultation/collaboration.  Flint Melter, MD 10/07/12 [phone]

## 2021-08-10 ENCOUNTER — Ambulatory Visit (HOSPITAL_COMMUNITY)
Admission: EM | Admit: 2021-08-10 | Discharge: 2021-08-11 | Disposition: A | Discharge status: Home / Self Care | Payer: Worker's Compensation | Attending: Emergency Medicine | Admitting: Emergency Medicine

## 2021-08-10 ENCOUNTER — Other Ambulatory Visit: Payer: Self-pay

## 2021-08-10 ENCOUNTER — Emergency Department (HOSPITAL_COMMUNITY): Payer: Worker's Compensation | Admitting: Anesthesiology

## 2021-08-10 ENCOUNTER — Ambulatory Visit (HOSPITAL_COMMUNITY): Admission: EM | Admit: 2021-08-10 | Discharge: 2021-08-10 | Payer: Self-pay

## 2021-08-10 ENCOUNTER — Encounter (HOSPITAL_COMMUNITY)
Admission: EM | Disposition: A | Discharge status: Home / Self Care | Payer: Self-pay | Source: Home / Self Care | Attending: Emergency Medicine

## 2021-08-10 ENCOUNTER — Encounter (HOSPITAL_COMMUNITY): Payer: Self-pay | Admitting: *Deleted

## 2021-08-10 ENCOUNTER — Emergency Department (HOSPITAL_COMMUNITY): Payer: Worker's Compensation

## 2021-08-10 DIAGNOSIS — S6991XA Unspecified injury of right wrist, hand and finger(s), initial encounter: Secondary | ICD-10-CM | POA: Diagnosis present

## 2021-08-10 DIAGNOSIS — Z20822 Contact with and (suspected) exposure to covid-19: Secondary | ICD-10-CM | POA: Insufficient documentation

## 2021-08-10 DIAGNOSIS — S66326A Laceration of extensor muscle, fascia and tendon of right little finger at wrist and hand level, initial encounter: Secondary | ICD-10-CM | POA: Diagnosis not present

## 2021-08-10 DIAGNOSIS — S66324A Laceration of extensor muscle, fascia and tendon of right ring finger at wrist and hand level, initial encounter: Secondary | ICD-10-CM | POA: Diagnosis not present

## 2021-08-10 DIAGNOSIS — S62644B Nondisplaced fracture of proximal phalanx of right ring finger, initial encounter for open fracture: Secondary | ICD-10-CM

## 2021-08-10 DIAGNOSIS — S65514A Laceration of blood vessel of right ring finger, initial encounter: Secondary | ICD-10-CM | POA: Diagnosis not present

## 2021-08-10 DIAGNOSIS — Z79899 Other long term (current) drug therapy: Secondary | ICD-10-CM | POA: Diagnosis not present

## 2021-08-10 DIAGNOSIS — Y990 Civilian activity done for income or pay: Secondary | ICD-10-CM | POA: Insufficient documentation

## 2021-08-10 DIAGNOSIS — Z23 Encounter for immunization: Secondary | ICD-10-CM | POA: Diagnosis not present

## 2021-08-10 DIAGNOSIS — S65516A Laceration of blood vessel of right little finger, initial encounter: Secondary | ICD-10-CM | POA: Insufficient documentation

## 2021-08-10 DIAGNOSIS — S62614B Displaced fracture of proximal phalanx of right ring finger, initial encounter for open fracture: Secondary | ICD-10-CM | POA: Diagnosis not present

## 2021-08-10 DIAGNOSIS — S62609B Fracture of unspecified phalanx of unspecified finger, initial encounter for open fracture: Secondary | ICD-10-CM

## 2021-08-10 DIAGNOSIS — W230XXA Caught, crushed, jammed, or pinched between moving objects, initial encounter: Secondary | ICD-10-CM | POA: Insufficient documentation

## 2021-08-10 DIAGNOSIS — S62624B Displaced fracture of medial phalanx of right ring finger, initial encounter for open fracture: Secondary | ICD-10-CM | POA: Insufficient documentation

## 2021-08-10 DIAGNOSIS — S62626B Displaced fracture of medial phalanx of right little finger, initial encounter for open fracture: Secondary | ICD-10-CM | POA: Insufficient documentation

## 2021-08-10 HISTORY — PX: I & D EXTREMITY: SHX5045

## 2021-08-10 HISTORY — PX: OPEN REDUCTION INTERNAL FIXATION (ORIF) FINGER WITH RADIAL BONE GRAFT: SHX5666

## 2021-08-10 HISTORY — PX: ARTERY AND TENDON REPAIR: SHX5696

## 2021-08-10 LAB — RESP PANEL BY RT-PCR (FLU A&B, COVID) ARPGX2
Influenza A by PCR: NEGATIVE
Influenza B by PCR: NEGATIVE
SARS Coronavirus 2 by RT PCR: NEGATIVE

## 2021-08-10 LAB — POC URINE PREG, ED: Preg Test, Ur: NEGATIVE

## 2021-08-10 SURGERY — IRRIGATION AND DEBRIDEMENT EXTREMITY
Anesthesia: General | Site: Finger | Laterality: Right

## 2021-08-10 MED ORDER — CEFAZOLIN SODIUM-DEXTROSE 1-4 GM/50ML-% IV SOLN
1.0000 g | Freq: Once | INTRAVENOUS | Status: AC
  Administered 2021-08-10: 1 g via INTRAVENOUS
  Filled 2021-08-10: qty 50

## 2021-08-10 MED ORDER — SODIUM CHLORIDE 0.9 % IV SOLN: INTRAVENOUS | Status: DC | PRN

## 2021-08-10 MED ORDER — MIDAZOLAM HCL 2 MG/2ML IJ SOLN
INTRAMUSCULAR | Status: AC
  Filled 2021-08-10: qty 2

## 2021-08-10 MED ORDER — FENTANYL CITRATE (PF) 250 MCG/5ML IJ SOLN
INTRAMUSCULAR | Status: AC
  Filled 2021-08-10: qty 5

## 2021-08-10 MED ORDER — PHENYLEPHRINE HCL (PRESSORS) 10 MG/ML IV SOLN
INTRAVENOUS | Status: DC | PRN
  Administered 2021-08-10: 40 ug via INTRAVENOUS

## 2021-08-10 MED ORDER — ONDANSETRON HCL 4 MG/2ML IJ SOLN
4.0000 mg | Freq: Once | INTRAMUSCULAR | Status: AC
  Administered 2021-08-10: 4 mg via INTRAVENOUS
  Filled 2021-08-10: qty 2

## 2021-08-10 MED ORDER — PROPOFOL 10 MG/ML IV BOLUS
INTRAVENOUS | Status: DC | PRN
  Administered 2021-08-10: 150 mg via INTRAVENOUS

## 2021-08-10 MED ORDER — SCOPOLAMINE 1 MG/3DAYS TD PT72
MEDICATED_PATCH | TRANSDERMAL | Status: AC
  Filled 2021-08-10: qty 1

## 2021-08-10 MED ORDER — 0.9 % SODIUM CHLORIDE (POUR BTL) OPTIME
TOPICAL | Status: DC | PRN
  Administered 2021-08-10: 1000 mL

## 2021-08-10 MED ORDER — PHENYLEPHRINE HCL-NACL 20-0.9 MG/250ML-% IV SOLN
INTRAVENOUS | Status: DC | PRN
  Administered 2021-08-10: 40 ug/min via INTRAVENOUS

## 2021-08-10 MED ORDER — LIDOCAINE 2% (20 MG/ML) 5 ML SYRINGE
INTRAMUSCULAR | Status: DC | PRN
  Administered 2021-08-10: 20 mg via INTRAVENOUS

## 2021-08-10 MED ORDER — CEFAZOLIN SODIUM-DEXTROSE 1-4 GM/50ML-% IV SOLN
INTRAVENOUS | Status: DC | PRN
  Administered 2021-08-10: 1 g via INTRAVENOUS

## 2021-08-10 MED ORDER — TETANUS-DIPHTH-ACELL PERTUSSIS 5-2.5-18.5 LF-MCG/0.5 IM SUSY
0.5000 mL | PREFILLED_SYRINGE | Freq: Once | INTRAMUSCULAR | Status: AC
  Administered 2021-08-10: 0.5 mL via INTRAMUSCULAR
  Filled 2021-08-10: qty 0.5

## 2021-08-10 MED ORDER — ROCURONIUM BROMIDE 10 MG/ML (PF) SYRINGE
PREFILLED_SYRINGE | INTRAVENOUS | Status: DC | PRN
  Administered 2021-08-10: 30 mg via INTRAVENOUS
  Administered 2021-08-11: 10 mg via INTRAVENOUS
  Administered 2021-08-11: 5 mg via INTRAVENOUS

## 2021-08-10 MED ORDER — DEXAMETHASONE SODIUM PHOSPHATE 10 MG/ML IJ SOLN
INTRAMUSCULAR | Status: DC | PRN
  Administered 2021-08-10: 5 mg via INTRAVENOUS

## 2021-08-10 MED ORDER — SUCCINYLCHOLINE CHLORIDE 200 MG/10ML IV SOSY
PREFILLED_SYRINGE | INTRAVENOUS | Status: DC | PRN
  Administered 2021-08-10: 100 mg via INTRAVENOUS

## 2021-08-10 MED ORDER — MORPHINE SULFATE (PF) 4 MG/ML IV SOLN
4.0000 mg | Freq: Once | INTRAVENOUS | Status: AC
  Administered 2021-08-10: 4 mg via INTRAVENOUS
  Filled 2021-08-10: qty 1

## 2021-08-10 MED ORDER — MIDAZOLAM HCL 5 MG/5ML IJ SOLN
INTRAMUSCULAR | Status: DC | PRN
  Administered 2021-08-10: 2 mg via INTRAVENOUS

## 2021-08-10 MED ORDER — SCOPOLAMINE 1 MG/3DAYS TD PT72
MEDICATED_PATCH | TRANSDERMAL | Status: DC | PRN
  Administered 2021-08-10: 1 via TRANSDERMAL

## 2021-08-10 MED ORDER — FENTANYL CITRATE (PF) 100 MCG/2ML IJ SOLN
INTRAMUSCULAR | Status: DC | PRN
  Administered 2021-08-10: 50 ug via INTRAVENOUS
  Administered 2021-08-11 (×2): 25 ug via INTRAVENOUS

## 2021-08-10 MED ORDER — BUPIVACAINE HCL (PF) 0.25 % IJ SOLN
INTRAMUSCULAR | Status: AC
  Filled 2021-08-10: qty 30

## 2021-08-10 SURGICAL SUPPLY — 74 items
ADAPTER CATH SYR TO TUBING 38M (ADAPTER) ×2 IMPLANT
ADPR CATH LL SYR 3/32 TPR (ADAPTER) ×1
BAG COUNTER SPONGE SURGICOUNT (BAG) ×2 IMPLANT
BAG SPNG CNTER NS LX DISP (BAG) ×1
BNDG CMPR 9X4 STRL LF SNTH (GAUZE/BANDAGES/DRESSINGS)
BNDG COHESIVE 2X5 TAN STRL LF (GAUZE/BANDAGES/DRESSINGS) IMPLANT
BNDG ELASTIC 3X5.8 VLCR STR LF (GAUZE/BANDAGES/DRESSINGS) ×2 IMPLANT
BNDG ELASTIC 4X5.8 VLCR STR LF (GAUZE/BANDAGES/DRESSINGS) ×2 IMPLANT
BNDG ESMARK 4X9 LF (GAUZE/BANDAGES/DRESSINGS) IMPLANT
BNDG GAUZE ELAST 4 BULKY (GAUZE/BANDAGES/DRESSINGS) ×2 IMPLANT
CANNULA VESSEL 3MM 2 BLNT TIP (CANNULA) IMPLANT
CORD BIPOLAR FORCEPS 12FT (ELECTRODE) ×2 IMPLANT
COVER SURGICAL LIGHT HANDLE (MISCELLANEOUS) ×2 IMPLANT
CUFF TOURN SGL QUICK 18X4 (TOURNIQUET CUFF) ×1 IMPLANT
CUFF TOURN SGL QUICK 24 (TOURNIQUET CUFF)
CUFF TRNQT CYL 24X4X16.5-23 (TOURNIQUET CUFF) IMPLANT
DECANTER SPIKE VIAL GLASS SM (MISCELLANEOUS) ×3 IMPLANT
DRAIN PENROSE 1/4X12 LTX STRL (WOUND CARE) IMPLANT
DRAPE C-ARM MINI 42X72 WSTRAPS (DRAPES) ×1 IMPLANT
DRAPE MICROSCOPE LEICA (MISCELLANEOUS) ×1 IMPLANT
DRAPE MICROSCOPE LEICA 54X105 (DRAPES) ×1 IMPLANT
DRSG PAD ABDOMINAL 8X10 ST (GAUZE/BANDAGES/DRESSINGS) ×4 IMPLANT
DRSG XEROFORM 1X8 (GAUZE/BANDAGES/DRESSINGS) ×1 IMPLANT
GAUZE 4X4 16PLY ~~LOC~~+RFID DBL (SPONGE) ×1 IMPLANT
GAUZE SPONGE 4X4 12PLY STRL (GAUZE/BANDAGES/DRESSINGS) ×2 IMPLANT
GAUZE XEROFORM 1X8 LF (GAUZE/BANDAGES/DRESSINGS) ×2 IMPLANT
GLOVE SRG 8 PF TXTR STRL LF DI (GLOVE) ×1 IMPLANT
GLOVE SURG ENC MOIS LTX SZ6.5 (GLOVE) ×1 IMPLANT
GLOVE SURG ENC MOIS LTX SZ7 (GLOVE) ×1 IMPLANT
GLOVE SURG ENC MOIS LTX SZ7.5 (GLOVE) ×3 IMPLANT
GLOVE SURG ORTHO LTX SZ8 (GLOVE) IMPLANT
GLOVE SURG POLY ORTHO LF SZ8 (GLOVE) IMPLANT
GLOVE SURG UNDER POLY LF SZ8 (GLOVE) ×2
GLOVE SURG UNDER POLY LF SZ8.5 (GLOVE) IMPLANT
GOWN STRL REUS W/ TWL LRG LVL3 (GOWN DISPOSABLE) ×1 IMPLANT
GOWN STRL REUS W/ TWL XL LVL3 (GOWN DISPOSABLE) ×1 IMPLANT
GOWN STRL REUS W/TWL LRG LVL3 (GOWN DISPOSABLE) ×2
GOWN STRL REUS W/TWL XL LVL3 (GOWN DISPOSABLE) ×2
IV NS IRRIG 3000ML ARTHROMATIC (IV SOLUTION) ×1 IMPLANT
K-WIRE .035 (WIRE) ×6
K-WIRE .8 (WIRE) ×1 IMPLANT
KIT BASIN OR (CUSTOM PROCEDURE TRAY) ×2 IMPLANT
KIT TURNOVER KIT B (KITS) ×2 IMPLANT
KWIRE .035 (WIRE) ×3 IMPLANT
KWIRE FIXATION L4 .035 (WIRE) IMPLANT
LOOP VESSEL MAXI BLUE (MISCELLANEOUS) IMPLANT
MANIFOLD NEPTUNE II (INSTRUMENTS) ×1 IMPLANT
NDL HYPO 25X1 1.5 SAFETY (NEEDLE) IMPLANT
NEEDLE HYPO 25X1 1.5 SAFETY (NEEDLE) ×2 IMPLANT
NS IRRIG 1000ML POUR BTL (IV SOLUTION) ×2 IMPLANT
PACK ORTHO EXTREMITY (CUSTOM PROCEDURE TRAY) ×2 IMPLANT
PAD ARMBOARD 7.5X6 YLW CONV (MISCELLANEOUS) ×4 IMPLANT
PADDING CAST SYNTHETIC 4 (CAST SUPPLIES) ×1
PADDING CAST SYNTHETIC 4X4 STR (CAST SUPPLIES) IMPLANT
SET CYSTO W/LG BORE CLAMP LF (SET/KITS/TRAYS/PACK) ×1 IMPLANT
SOL PREP POV-IOD 4OZ 10% (MISCELLANEOUS) ×4 IMPLANT
SPLINT PLASTER EXTRA FAST 3X15 (CAST SUPPLIES) ×1
SPLINT PLASTER GYPS XFAST 3X15 (CAST SUPPLIES) IMPLANT
SPONGE T-LAP 18X18 ~~LOC~~+RFID (SPONGE) ×1 IMPLANT
SPONGE T-LAP 4X18 ~~LOC~~+RFID (SPONGE) ×2 IMPLANT
SUT ETHILON 4 0 P 3 18 (SUTURE) ×2 IMPLANT
SUT ETHILON 4 0 PS 2 18 (SUTURE) ×2 IMPLANT
SUT ETHILON 9 0 BV130 4 (SUTURE) ×1 IMPLANT
SUT MERSILENE 4 0 P 3 (SUTURE) ×1 IMPLANT
SUT MON AB 5-0 P3 18 (SUTURE) IMPLANT
SWAB COLLECTION DEVICE MRSA (MISCELLANEOUS) IMPLANT
SWAB CULTURE ESWAB REG 1ML (MISCELLANEOUS) IMPLANT
SYR 20ML LL LF (SYRINGE) ×2 IMPLANT
SYR CONTROL 10ML LL (SYRINGE) IMPLANT
TOWEL GREEN STERILE (TOWEL DISPOSABLE) ×2 IMPLANT
TUBE CONNECTING 12X1/4 (SUCTIONS) ×2 IMPLANT
TUBE FEEDING ENTERAL 5FR 16IN (TUBING) IMPLANT
UNDERPAD 30X36 HEAVY ABSORB (UNDERPADS AND DIAPERS) ×2 IMPLANT
YANKAUER SUCT BULB TIP NO VENT (SUCTIONS) ×2 IMPLANT

## 2021-08-10 NOTE — ED Notes (Signed)
Pt's hand was bandaged d/t pt needing to get up to use restroom. Pt ambulated to bathroom without need of assistance and without incident.

## 2021-08-10 NOTE — ED Notes (Signed)
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 Finger injury to 4th and 5th finger to right hand. Pt is right hand dominant. RUE has intact pulses and warm to touch. Skin color is appropriate. Bleeding is controlled by gauze and pressure dressing. Will continue to monitor.

## 2021-08-10 NOTE — Anesthesia Preprocedure Evaluation (Addendum)
Anesthesia Evaluation  Patient identified by MRN, date of birth, ID band Patient awake    Reviewed: Allergy & Precautions, NPO status , Patient's Chart, lab work & pertinent test results  History of Anesthesia Complications Negative for: history of anesthetic complications  Airway Mallampati: II  TM Distance: >3 FB Neck ROM: Full    Dental  (+) Dental Advisory Given   Pulmonary neg pulmonary ROS,    breath sounds clear to auscultation       Cardiovascular negative cardio ROS   Rhythm:Regular Rate:Normal     Neuro/Psych negative neurological ROS     GI/Hepatic Neg liver ROS, nausea   Endo/Other  negative endocrine ROS  Renal/GU negative Renal ROS     Musculoskeletal 4th, 5th finger injuries sustained by machine at work   Abdominal   Peds  Hematology negative hematology ROS (+)   Anesthesia Other Findings   Reproductive/Obstetrics                            Anesthesia Physical Anesthesia Plan  ASA: 1  Anesthesia Plan: General   Post-op Pain Management:    Induction: Intravenous and Rapid sequence  PONV Risk Score and Plan: 3 and Ondansetron, Dexamethasone and Scopolamine patch - Pre-op  Airway Management Planned: Oral ETT  Additional Equipment: None  Intra-op Plan:   Post-operative Plan: Extubation in OR  Informed Consent: I have reviewed the patients History and Physical, chart, labs and discussed the procedure including the risks, benefits and alternatives for the proposed anesthesia with the patient or authorized representative who has indicated his/her understanding and acceptance.     Dental advisory given and Interpreter used for interveiw  Plan Discussed with: CRNA and Surgeon  Anesthesia Plan Comments:        Anesthesia Quick Evaluation

## 2021-08-10 NOTE — Anesthesia Procedure Notes (Signed)
Procedure Name: Intubation Date/Time: 08/10/2021 11:16 PM Performed by: Edmonia Caprio, CRNA Pre-anesthesia Checklist: Patient identified, Emergency Drugs available, Suction available, Patient being monitored and Timeout performed Patient Re-evaluated:Patient Re-evaluated prior to induction Oxygen Delivery Method: Circle system utilized Preoxygenation: Pre-oxygenation with 100% oxygen Induction Type: IV induction, Rapid sequence and Cricoid Pressure applied Laryngoscope Size: Miller and 2 Grade View: Grade I Tube type: Oral Tube size: 7.0 mm Number of attempts: 1 Airway Equipment and Method: Stylet Placement Confirmation: ETT inserted through vocal cords under direct vision, positive ETCO2 and breath sounds checked- equal and bilateral Secured at: 23 cm Tube secured with: Tape Dental Injury: Teeth and Oropharynx as per pre-operative assessment

## 2021-08-10 NOTE — ED Triage Notes (Signed)
The pt has lacerated  rt fing and little fingers  she cut them on a machine at work just prior to arrival here  [facility]  bandaged there lmp July 28th

## 2021-08-10 NOTE — H&P (Signed)
Kristy Turner is an 45 y.o. female.   Chief Complaint: Right ring and small finger injury HPI: 45 year old right-hand-dominant female present with her husband states she injured her right hand at work today.  A video interpreter is used for this visit, [name], [phone]).  She states her right hand got caught in a tortilla machine.  She sustained lacerations to the right ring and small fingers.  There is deformity to the fingers.  She was seen at [facility] emergency department where radiographs were taken revealing fractures of the ring and small fingers.  She also has lacerations to both digits.  She reports no previous injury to the hand and no other injuries at this time.  There is associated wound and bleeding.  Case discussed with [name], PA-C and her note from 08/10/2021 reviewed. Xrays viewed and interpreted by me: AP lateral and oblique views of the right hand and digits show fracture at the condylar neck of the middle phalanx of the ring finger and fracture at the condyles of the proximal phalanx of the ring finger with what appears to be a fracture in the base of the middle phalanx of the ring finger as well.  Also appears to be dislocation of the PIP joint of the ring finger. Labs reviewed: None  Allergies: No Known Allergies  History reviewed. No pertinent past medical history.  History reviewed. No pertinent surgical history.  Family History: No family history on file.  Social History:   reports that she does not drink alcohol and does not use drugs. No history on file for tobacco use.  Medications: (Not in a hospital admission)   Results for orders placed or performed during the hospital encounter of 08/10/21 (from the past 48 hour(s))  POC urine preg, ED     Status: None   Collection Time: 08/10/21  9:57 PM  Result Value Ref Range   Preg Test, Ur NEGATIVE NEGATIVE    Comment:        THE SENSITIVITY OF THIS METHODOLOGY IS >24 mIU/mL     DG Hand Complete  Right  Result Date: 08/10/2021 CLINICAL DATA:  Injury to the fourth and fifth fingers. Fingers caught in a tortilla machine. EXAM: RIGHT HAND - COMPLETE 3+ VIEW COMPARISON:  None. FINDINGS: Comminuted fractures of the distal proximal phalanx of the right fourth finger with extension to the proximal interphalangeal joint surface and dislocation of the proximal interphalangeal joint with complete dorsal displacement of the middle phalanx with respect to the proximal phalanx. Articular fractures are also demonstrated in the proximal aspect of the middle phalanx. Displaced fractures of the middle phalanx of the right fifth finger with flexion deformity, likely indicating ligamentous injury as well. Associated soft tissue swelling. No radiopaque foreign bodies are demonstrated. IMPRESSION: Comminuted and displaced fracture dislocations of the right fourth finger involving the proximal interphalangeal joint and of the right fifth finger involving the middle phalanx. Electronically Signed   By: Burman Nieves M.D.   On: 08/10/2021 18:41     A comprehensive review of systems was negative. Review of Systems: No fevers, chills, night sweats, chest pain, shortness of breath, nausea, vomiting, diarrhea, constipation, easy bleeding or bruising, headaches, dizziness, vision changes, fainting.   Blood pressure 129/89, pulse 94, temperature 98.7 F (37.1 C), temperature source Oral, resp. rate 16, height 5' (1.524 m), weight 54.4 kg, last menstrual period 07/15/2021, SpO2 100 %.  General appearance: alert, cooperative, and appears stated age Head: Normocephalic, without obvious abnormality, atraumatic Neck: supple,  symmetrical, trachea midline Resp: clear to auscultation bilaterally Cardio: regular rate and rhythm Extremities: Intact sensation and capillary refill all digits.  +epl/fpl/io.  She states she is able to feel both the radial and ulnar sides of the ring and small fingers and that it feels comparable to  the other digits.  She has intact capillary refill in the digits though more sluggish in the small finger.  She is able to lightly flex at the DIP joints.  There is laceration to volar aspect of the PIP joint of the ring finger and laceration at the volar and ulnar aspects of the DIP joint of the small finger. Pulses: 2+ and symmetric Skin: Skin color, texture, turgor normal. No rashes or lesions Neurologic: Grossly normal Incision/Wound: As above  Assessment/Plan Right ring and small finger injuries with open fractures as described above and possible tendon artery nerve injury.  Recommend OR for irrigation and debridement of open fractures with likely pin fixation of the fractures and repair of tendon artery nerve is necessary.  Risks, benefits and alternatives of surgery were discussed including risks of blood loss, infection, damage to nerves/vessels/tendons/ligament/bone, failure of surgery, need for additional surgery, complication with wound healing, stiffness, nonunion, malunion, amputation.  She voiced understanding of these risks and elected to proceed.    [name] 08/10/2021, 10:59 PM

## 2021-08-10 NOTE — ED Notes (Signed)
Patient is being discharged from the Urgent Care and sent to the Emergency Department via personal vehicle with employer . Per Provider Chales Salmon, patient is in need of higher level of care due to severity of hand injury/laceration. Patient is aware and verbalizes understanding of plan of care. There were no vitals filed for this visit.

## 2021-08-10 NOTE — ED Provider Notes (Addendum)
[name] EMERGENCY DEPARTMENT Provider Note   CSN: [id number] Arrival date & time: 08/10/21  1737     History Chief Complaint  Patient presents with   lacerated fingers    Kristy Turner is a 45 y.o. female.  45 year old female presents with lacerations to the right fourth and fifth fingers which occurred today at work when she was using a machine to make tortillas.  Patient states that her fingers were "squished in the machine and then pulled off."  Patient went to the urgent care and was then sent to the emergency room for evaluation.  Patient is right-hand dominant, last tetanus unknown, last ate at 1 PM today.  Unable to move the fingers and feels like they are "sleeping."  No significant history.  No other injuries.      History reviewed. No pertinent past medical history.  Patient Active Problem List   Diagnosis Date Noted   Breast lump on right side at 6 o'clock position 02/04/2012    History reviewed. No pertinent surgical history.   OB History   No obstetric history on file.     No family history on file.  Social History   Tobacco Use   Smoking status: Never  Substance Use Topics   Alcohol use: No   Drug use: No    Home Medications Prior to Admission medications   Medication Sig Start Date End Date Taking? Authorizing Provider  ALPRAZolam (XANAX) 0.25 MG tablet Take 1 tablet (0.25 mg total) by mouth every 6 (six) hours as needed for sleep or anxiety. 10/07/12   Ivonne Andrew, PA-C    Allergies    Patient has no known allergies.  Review of Systems   Review of Systems  Constitutional:  Negative for fever.  Musculoskeletal:  Positive for arthralgias.  Skin:  Positive for color change and wound.  Allergic/Immunologic: Negative for immunocompromised state.  Neurological:  Positive for weakness and numbness.  Hematological:  Does not bruise/bleed easily.  All other systems reviewed and are negative.  Physical  Exam Updated Vital Signs BP 129/89 (BP Location: Left Arm)   Pulse 94   Temp 98.7 F (37.1 C) (Oral)   Resp 16   Ht 5' (1.524 m)   Wt 54.4 kg   LMP 07/15/2021   SpO2 100%   BMI 23.44 kg/m   Physical Exam Vitals and nursing note reviewed.  Cardiovascular:     Pulses: Normal pulses.  Musculoskeletal:        General: Swelling, tenderness, deformity and signs of injury present.     Comments: Patient does not allow me to touch her fourth or fifth fingers for fear of pain.  She appears to have a laceration to the distal right fifth phalanx with deformity, tip of the digit appears slightly dusky.  Laceration to the fourth finger proximally extending to middle phalanx, appears to have normal capillary refill as patient checks for cap refill.  She reports both fingers feel like they are sleeping when she touches the tips of her fingers. No pain to the remaining digits or hand.  Skin:    General: Skin is warm and dry.  Neurological:     Sensory: Sensory deficit present.     Motor: Weakness present.       ED Results / Procedures / Treatments   Labs (all labs ordered are listed, but only abnormal results are displayed) Labs Reviewed  RESP PANEL BY RT-PCR (FLU A&B, COVID) ARPGX2  POC URINE PREG, ED  EKG None  Radiology DG Hand Complete Right  Result Date: 08/10/2021 CLINICAL DATA:  Injury to the fourth and fifth fingers. Fingers caught in a tortilla machine. EXAM: RIGHT HAND - COMPLETE 3+ VIEW COMPARISON:  None. FINDINGS: Comminuted fractures of the distal proximal phalanx of the right fourth finger with extension to the proximal interphalangeal joint surface and dislocation of the proximal interphalangeal joint with complete dorsal displacement of the middle phalanx with respect to the proximal phalanx. Articular fractures are also demonstrated in the proximal aspect of the middle phalanx. Displaced fractures of the middle phalanx of the right fifth finger with flexion deformity,  likely indicating ligamentous injury as well. Associated soft tissue swelling. No radiopaque foreign bodies are demonstrated. IMPRESSION: Comminuted and displaced fracture dislocations of the right fourth finger involving the proximal interphalangeal joint and of the right fifth finger involving the middle phalanx. Electronically Signed   By: Burman Nieves M.D.   On: 08/10/2021 18:41    Procedures Procedures   Medications Ordered in ED Medications  scopolamine (TRANSDERM-SCOP) 1 MG/3DAYS (has no administration in time range)  Tdap (BOOSTRIX) injection 0.5 mL (0.5 mLs Intramuscular Given 08/10/21 1815)  ceFAZolin (ANCEF) IVPB 1 g/50 mL premix (0 g Intravenous Stopped 08/10/21 1835)  ondansetron (ZOFRAN) injection 4 mg (4 mg Intravenous Given 08/10/21 1811)  morphine 4 MG/ML injection 4 mg (4 mg Intravenous Given 08/10/21 1811)  morphine 4 MG/ML injection 4 mg (4 mg Intravenous Given 08/10/21 1956)    ED Course  I have reviewed the triage vital signs and the nursing notes.  Pertinent labs & imaging results that were available during my care of the patient were reviewed by me and considered in my medical decision making (see chart for details).  Clinical Course as of 08/10/21 2240  Sat Aug 10, 2021  [id number] 45 year old female with injuries to the right 4th and 5th fingers as above. XR + fx. Given Ancef, Tdap updated. NPO since 1PM Case discussed with Dr. Merlyn Lot with hand ortho who will take patient to the OR. Patient understand plan and will have a ride able to take her home afterwards.  [LM]    Clinical Course User Index [LM] [name]   MDM Rules/Calculators/A&P                           Final Clinical Impression(s) / ED Diagnoses Final diagnoses:  Open fracture dislocation of finger, initial encounter  Open nondisplaced fracture of proximal phalanx of right ring finger, initial encounter    Rx / DC Orders ED Discharge Orders     None        Jeannie Fend,  PA-C 08/10/21 1943    Jeannie Fend, PA-C 08/10/21 2240    Wynetta Fines, MD 08/14/21 1453

## 2021-08-11 ENCOUNTER — Emergency Department (HOSPITAL_COMMUNITY): Payer: Worker's Compensation

## 2021-08-11 MED ORDER — HYDROCODONE-ACETAMINOPHEN 5-325 MG PO TABS: ORAL_TABLET | ORAL | 0 refills | Status: DC

## 2021-08-11 MED ORDER — ASPIRIN EC 325 MG PO TBEC
325.0000 mg | DELAYED_RELEASE_TABLET | Freq: Every day | ORAL | 0 refills | Status: AC
Start: 2021-08-11 — End: ?

## 2021-08-11 MED ORDER — MEPERIDINE HCL 25 MG/ML IJ SOLN: 6.2500 mg | INTRAMUSCULAR | Status: DC | PRN

## 2021-08-11 MED ORDER — PROMETHAZINE HCL 25 MG/ML IJ SOLN
6.2500 mg | INTRAMUSCULAR | Status: DC | PRN
Start: 2021-08-11 — End: 2021-08-11

## 2021-08-11 MED ORDER — HYDROMORPHONE HCL 1 MG/ML IJ SOLN: 0.2500 mg | INTRAMUSCULAR | Status: DC | PRN

## 2021-08-11 MED ORDER — LACTATED RINGERS IV SOLN: INTRAVENOUS | Status: DC | PRN

## 2021-08-11 MED ORDER — BUPIVACAINE HCL (PF) 0.25 % IJ SOLN
INTRAMUSCULAR | Status: DC | PRN
  Administered 2021-08-11: 10 mL

## 2021-08-11 MED ORDER — OXYCODONE HCL 5 MG PO TABS: 5.0000 mg | ORAL_TABLET | Freq: Once | ORAL | Status: DC | PRN

## 2021-08-11 MED ORDER — ONDANSETRON HCL 4 MG/2ML IJ SOLN
INTRAMUSCULAR | Status: DC | PRN
  Administered 2021-08-11: 4 mg via INTRAVENOUS

## 2021-08-11 MED ORDER — MIDAZOLAM HCL 2 MG/2ML IJ SOLN: 0.5000 mg | Freq: Once | INTRAMUSCULAR | Status: DC | PRN

## 2021-08-11 MED ORDER — SUGAMMADEX SODIUM 200 MG/2ML IV SOLN
INTRAVENOUS | Status: DC | PRN
  Administered 2021-08-11: 200 mg via INTRAVENOUS

## 2021-08-11 MED ORDER — OXYCODONE HCL 5 MG/5ML PO SOLN: 5.0000 mg | Freq: Once | ORAL | Status: DC | PRN

## 2021-08-11 MED ORDER — SULFAMETHOXAZOLE-TRIMETHOPRIM 800-160 MG PO TABS: 1.0000 | ORAL_TABLET | Freq: Two times a day (BID) | ORAL | 0 refills | Status: DC

## 2021-08-11 NOTE — Discharge Instructions (Addendum)

## 2021-08-11 NOTE — Transfer of Care (Signed)
Immediate Anesthesia Transfer of Care Note  Patient: Kristy Turner  Procedure(s) Performed: IRRIGATION AND DEBRIDEMENT RING AND SMALL FINGERS (Right: Finger) ARTERY AND TENDON REPAIR RING AND SMALL FINGERS (Right: Finger) OPEN REDUCTION INTERNAL FIXATION (ORIF) RING AND SMALL FINGERS (Right: Finger)  Patient Location: PACU  Anesthesia Type:General  Level of Consciousness: drowsy and responds to stimulation  Airway & Oxygen Therapy: Patient Spontanous Breathing  Post-op Assessment: Report given to RN, Post -op Vital signs reviewed and stable and Patient moving all extremities X 4  Post vital signs: Reviewed and stable  Last Vitals:  Vitals Value Taken Time  BP 118/75 08/11/21 0236  Temp    Pulse 96 08/11/21 0240  Resp 16 08/11/21 0240  SpO2 98 % 08/11/21 0240  Vitals shown include unvalidated device data.  Last Pain:  Vitals:   08/10/21 2221  TempSrc: Oral  PainSc:          Complications: No notable events documented.

## 2021-08-11 NOTE — Anesthesia Postprocedure Evaluation (Signed)
Anesthesia Post Note  Patient: [name]  Procedure(s) Performed: IRRIGATION AND DEBRIDEMENT RING AND SMALL FINGERS (Right: Finger) ARTERY AND TENDON REPAIR RING AND SMALL FINGERS (Right: Finger) OPEN REDUCTION INTERNAL FIXATION (ORIF) RING AND SMALL FINGERS (Right: Finger)     Patient location during evaluation: PACU Anesthesia Type: General Level of consciousness: awake and alert, patient cooperative and oriented Pain management: pain level controlled Vital Signs Assessment: post-procedure vital signs reviewed and stable Respiratory status: spontaneous breathing, nonlabored ventilation and respiratory function stable Cardiovascular status: blood pressure returned to baseline and stable Postop Assessment: no apparent nausea or vomiting Anesthetic complications: no   No notable events documented.  Last Vitals:  Vitals:   08/11/21 0305 08/11/21 0320  BP: 122/75 122/66  Pulse: 95 96  Resp: 18 16  Temp:  36.7 C  SpO2: 99% 98%    Last Pain:  Vitals:   08/11/21 0320  TempSrc:   PainSc: 0-No pain                 Antuane Eastridge,E. Erma Joubert

## 2021-08-11 NOTE — Op Note (Addendum)
NAME: Kristy Turner MEDICAL RECORD NO: [id number] DATE OF BIRTH: July 03, 1976 FACILITY: [facility] LOCATION: [facility] OR PHYSICIAN: Tami Ribas, MD   OPERATIVE REPORT   DATE OF PROCEDURE: 08/11/21    PREOPERATIVE DIAGNOSIS: Right ring and small finger crush injury with open fractures possible nerve and vessel injury   POSTOPERATIVE DIAGNOSIS: 1.  Right ring finger open proximal phalanx intra-articular condyle fracture 2.  Right ring finger open middle phalanx intra-articular base fracture 3.  Right ring finger radial digital artery laceration 4.  Right ring finger extensor tendon laceration 5.  Right small finger open middle phalanx condylar neck fracture 6.  Right small finger ulnar digital artery laceration 7.  Right small finger extensor tendon laceration   PROCEDURE: 1.  Right ring finger irrigation and debridement of open proximal phalanx intra-articular condyle fracture and middle phalanx intra-articular base fractures including removal of bone fragments 2.  Right ring finger open reduction and pin fixation of proximal phalanx intra-articular condyle fracture 3.  Right ring finger repair of radial digital artery laceration under microscope 4.  Right ring finger repair of extensor tendon laceration at PIP joint 5.  Right small finger irrigation and debridement of open middle phalanx condylar neck fracture 6.  Right small finger open reduction pin fixation of middle phalanx condylar neck fracture 7.  Right small finger repair of ulnar digital artery laceration under microscope 8.  Right small finger repair of extensor tendon laceration   SURGEON:  [name], M.D.   ASSISTANT: none   ANESTHESIA:  General   INTRAVENOUS FLUIDS:  Per anesthesia flow sheet.   ESTIMATED BLOOD LOSS:  Minimal.   COMPLICATIONS:  None.   SPECIMENS:  none   TOURNIQUET TIME:    Total Tourniquet Time Documented: Upper Arm (Right) - 149 minutes Total: Upper Arm (Right) - 149 minutes     DISPOSITION:  Stable to PACU.   INDICATIONS: 45 year old right-hand-dominant female states while at work on 08/10/2021 she sustained a crushing injury to her right ring and small finger in a tortilla machine.  She was brought to [facility] emergency department where radiographs were taken revealing ring finger proximal phalanx intra-articular condyle fracture and middle phalanx base fracture with PIP dislocation as well as small finger middle phalanx condylar neck fracture.  She was also noted to have lacerations of both digits.  Recommended operative irrigation debridement of open fractures with reduction and fixation of fractures and repair of tendon artery nerve is necessary.  Risks, benefits and alternatives of surgery were discussed including the risks of blood loss, infection, damage to nerves, vessels, tendons, ligaments, bone for surgery, need for additional surgery, complications with wound healing, continued pain, nonunion, malunion,  stiffness, loss of viability of digit and need for amputation.  She voiced understanding of these risks and elected to proceed.  OPERATIVE COURSE:  After being identified preoperatively by myself,  the patient and I agreed on the procedure and site of the procedure.  The surgical site was marked.  Surgical consent had been signed. She was given IV antibiotics as preoperative antibiotic prophylaxis. She was transferred to the operating room and placed on the operating table in supine position with the right upper extremity on an arm board.  General anesthesia was induced by the anesthesiologist.  The right upper extremity was prepped and draped in normal sterile orthopedic fashion.  A surgical pause was performed between the surgeons, anesthesia, and operating room staff and all were in agreement as to the patient, procedure, and site  of procedure.  The wounds were explored.  There was noted to be laceration of the radial digital artery in the ring finger over the middle  phalanx.  The radial digital nerve was intact.  The ulnar digital nerve and artery were outside the zone of injury.  There was an open fracture of the condyles of the proximal phalanx.  There was loss of bone at the volar aspect of the middle phalanx base.  There is laceration at the dorsum of the ring finger over the PIP joint.  There was laceration of extensor tendon in longitudinal orientation.  The fracture was visible through this rent.  In the small finger there was circumferential laceration at the level of the DIP joint.  The radial digital nerve and artery were intact.  The ulnar digital nerve and artery had trifurcated.  There was a branch of the ulnar digital nerve intact.  The ulnar digital artery was lacerated.  The flexor tendons were intact in both digits.  The extensor tendon in the small finger was lacerated at the DIP joint.  Tourniquet the proximal aspect of the extremity was inflated to 250 mmHg after exsanguination limb with an Esmarch bandage.  The wounds and open fractures were all copiously irrigated with sterile saline.  The open fractures were debrided including skin subcutaneous tissues and tissue deep to the fascia.  The ring finger small fragments of devitalized bone were sharply removed with a knife.  Any devitalized skin edges were sharply debrided to remove devitalized tissue as well.  C-arm was used in AP lateral oblique projections throughout the case.  Reduction of the condylar fragments of the proximal phalanx of the ring finger was attempted.  There was comminution of these fragments.  There was one larger fragment at the radial side.  A 0.035 inch K wire was able to be passed through this fragment and into the middle phalanx stabilizing this fragment as well as the more proximal fracture portion into the proximal portion of the proximal phalanx.  An additional 0.035 inch K wire was advanced across the larger fracture fragment to stabilize this.  An ulnar-sided pin was not able  to be placed crossing the fracture.  C-arm was used in AP lateral and oblique projections to ensure acceptable reduction which was the case.  There was overall good alignment of the digit.  A 0.035 inch K wire was advanced from the tip of the small finger across the DIP joint and the fracture site into the middle phalanx.  A 0.028 inch K wire was used adjacent to this to prevent any rotation.  C-arm was used in AP lateral and oblique projections to ensure appropriate duction position of heart which was the case.  The pins were all bent and cut short.  Repair of the tensor tendon laceration of the small finger was performed with a 4-0 Mersilene suture in a figure-of-eight fashion.  The rent in the extensor tendon over the ring finger PIP joint was also repaired with a 4-0 Mersilene in a figure-of-eight fashion.  The wound on the dorsum of the ring finger was closed with 4-0 nylon in a horizontal mattress fashion.  The microscope was brought in.  This was used for microdissection of the radial digital artery of the ring finger.  The arterial ends were freshened and 9-0 nylon suture used in an interrupted circumferential fashion to reapproximate the arterial ends.  Good tension-free reapproximation was obtained.  The microscope was then used for microdissection of the ulnar  digital artery of the small finger.  Again the ends were freshened and the 9-0 nylon suture used in a interrupted circumferential fashion to reapproximate the arterial ends.  Good tension-free apposition was obtained.  There was intact ulnar digital nerve.  The nerve and artery had trifurcated.  The wounds were all irrigated with sterile saline.  They were closed with 4-0 nylon in a horizontal mattress fashion.  The tourniquet was deflated at 149 minutes.  Fingertips were pink with brisk capillary refill after deflation of tourniquet.  Digital blocks were performed with quarter percent plain Marcaine to aid in postoperative analgesia.  The wounds  were dressed with sterile Xeroform and 4 x 4's and wrapped with a Kerlix bandage.  A volar splint was placed and wrapped with Kerlix and Ace bandage.  The operative drapes were broken down.  The patient was awoken from anesthesia safely.  She was transferred back to the stretcher and taken to PACU in stable condition.  I will see her back in the office in 1 week for postoperative followup.  I will give her a prescription for Norco 5/325 1-2 tabs PO q6 hours prn pain, dispense # 25, Bactrim DS 1 p.o. twice daily x7 days, and aspirin 325 mg p.o. daily.   [name], MD Electronically signed, 08/11/21  Addendum (08/21/21): Correct artery repair in ring finger.

## 2021-08-12 ENCOUNTER — Encounter (HOSPITAL_COMMUNITY): Payer: Self-pay | Admitting: *Deleted

## 2021-08-12 ENCOUNTER — Emergency Department (HOSPITAL_COMMUNITY)
Admission: EM | Admit: 2021-08-12 | Discharge: 2021-08-12 | Disposition: A | Discharge status: Home / Self Care | Payer: Worker's Compensation | Attending: Emergency Medicine | Admitting: Emergency Medicine

## 2021-08-12 ENCOUNTER — Other Ambulatory Visit: Payer: Self-pay

## 2021-08-12 DIAGNOSIS — R202 Paresthesia of skin: Secondary | ICD-10-CM | POA: Insufficient documentation

## 2021-08-12 DIAGNOSIS — Z7982 Long term (current) use of aspirin: Secondary | ICD-10-CM | POA: Insufficient documentation

## 2021-08-12 DIAGNOSIS — G8918 Other acute postprocedural pain: Secondary | ICD-10-CM | POA: Diagnosis not present

## 2021-08-12 MED ORDER — HYDROCODONE-ACETAMINOPHEN 5-325 MG PO TABS
1.0000 | ORAL_TABLET | Freq: Once | ORAL | Status: AC
  Administered 2021-08-12: 1 via ORAL
  Filled 2021-08-12: qty 1

## 2021-08-12 NOTE — ED Notes (Signed)
Pt last took hydrocodone 5-325 at 1700; reports she has taken 3 doses (1 tab at 0500 08/12/21, 1 tab at 1100 on 08/12/21 and 2 tabs at 1700 08/12/21) with no relief

## 2021-08-12 NOTE — ED Triage Notes (Signed)
PT states R hand surgery yesterday.  She is worried b/c her R to fingers are a bit numb.  Denies increased pain.

## 2021-08-12 NOTE — ED Provider Notes (Signed)
[facility] EMERGENCY DEPARTMENT Provider Note   CSN: [id number] Arrival date & time: 08/12/21  0031     History Chief Complaint  Patient presents with   Post-op Problem    Kristy Turner is a 45 y.o. female.  Patient to ED for evaluation of post-op discoloration and numbness of her right 5th finger. She underwent surgical repair of tendon, artery and fracture injuries to the right 4th and 5th digits yesterday (Kuzma). Today she noticed the 5th finger has become quite pale and feels like she has less sensation. No other injury       History reviewed. No pertinent past medical history.  Patient Active Problem List   Diagnosis Date Noted   Breast lump on right side at 6 o'clock position 02/04/2012    History reviewed. No pertinent surgical history.   OB History   No obstetric history on file.     No family history on file.  Social History   Tobacco Use   Smoking status: Never  Substance Use Topics   Alcohol use: No   Drug use: No    Home Medications Prior to Admission medications   Medication Sig Start Date End Date Taking? Authorizing Provider  aspirin EC 325 MG tablet Take 1 tablet (325 mg total) by mouth daily. 08/11/21  Yes [name], MD  HYDROcodone-acetaminophen (NORCO) 5-325 MG tablet 1-2 tabs po q6 hours prn pain Patient taking differently: Take 1-2 tablets by mouth every 6 (six) hours as needed for moderate pain. 08/11/21  Yes [name], MD  Multiple Vitamin (MULTIVITAMIN) tablet Take 1 tablet by mouth at bedtime.   Yes [provider]  sulfamethoxazole-trimethoprim (BACTRIM DS) 800-160 MG tablet Take 1 tablet by mouth 2 (two) times daily. 08/11/21  Yes [name], MD  ALPRAZolam Prudy Feeler) 0.25 MG tablet Take 1 tablet (0.25 mg total) by mouth every 6 (six) hours as needed for sleep or anxiety. Patient not taking: Reported on 08/12/2021 10/07/12   Ivonne Andrew, PA-C    Allergies    Patient has no known  allergies.  Review of Systems   Review of Systems  Constitutional:  Negative for fever.  Musculoskeletal:        See HPI.  Skin:  Positive for color change.  Neurological:  Positive for numbness.   Physical Exam Updated Vital Signs BP 108/76 (BP Location: Left Arm)   Pulse 70   Temp 98.8 F (37.1 C) (Oral)   Resp 16   LMP 07/15/2021   SpO2 97%   Physical Exam Vitals and nursing note reviewed.  Constitutional:      Appearance: Normal appearance.  Musculoskeletal:     Comments: Right hand splint/bandage in place with open distal end. Digists 2-4 are pink, good capillary refill. 5th finger appears pale with surgical pins (2) present.   Skin:    General: Skin is warm and dry.  Neurological:     Mental Status: She is alert.    ED Results / Procedures / Treatments   Labs (all labs ordered are listed, but only abnormal results are displayed) Labs Reviewed - No data to display  EKG None  Radiology DG Hand Complete Right  Result Date: 08/10/2021 CLINICAL DATA:  Injury to the fourth and fifth fingers. Fingers caught in a tortilla machine. EXAM: RIGHT HAND - COMPLETE 3+ VIEW COMPARISON:  None. FINDINGS: Comminuted fractures of the distal proximal phalanx of the right fourth finger with extension to the proximal interphalangeal joint surface and dislocation of the  proximal interphalangeal joint with complete dorsal displacement of the middle phalanx with respect to the proximal phalanx. Articular fractures are also demonstrated in the proximal aspect of the middle phalanx. Displaced fractures of the middle phalanx of the right fifth finger with flexion deformity, likely indicating ligamentous injury as well. Associated soft tissue swelling. No radiopaque foreign bodies are demonstrated. IMPRESSION: Comminuted and displaced fracture dislocations of the right fourth finger involving the proximal interphalangeal joint and of the right fifth finger involving the middle phalanx.  Electronically Signed   By: Burman Nieves M.D.   On: 08/10/2021 18:41   DG MINI C-ARM IMAGE ONLY  Result Date: 08/11/2021 There is no interpretation for this exam.  This order is for images obtained during a surgical procedure.  Please See "Surgeries" Tab for more information regarding the procedure.    Procedures Procedures   Medications Ordered in ED Medications - No data to display  ED Course  I have reviewed the triage vital signs and the nursing notes.  Pertinent labs & imaging results that were available during my care of the patient were reviewed by me and considered in my medical decision making (see chart for details).    MDM Rules/Calculators/A&P                           Patient seen and evaluated by Dr. Judd Lien. After splint/bandage removed, the fingers appear bruised but have normal sensation and no compromised vascularity. Splint replaced with clean bandage.   The patient's daughter reports she is supposed to see Dr. Merlyn Lot later today (Monday). She is felt appropriate for discharge home to planned hand follow up.   Final Clinical Impression(s) / ED Diagnoses Final diagnoses:  None   Post-op pain  Rx / DC Orders ED Discharge Orders     None        Elpidio Anis, PA-C 08/12/21 0240    Geoffery Lyons, MD 08/12/21 [phone]

## 2021-08-12 NOTE — Discharge Instructions (Addendum)
Continue post-operative care instructions as provided by Dr. Merlyn Lot.   Return to the ED with any new or concerning symptoms at any time.

## 2022-03-27 ENCOUNTER — Other Ambulatory Visit: Payer: Self-pay | Admitting: Orthopedic Surgery

## 2022-04-01 ENCOUNTER — Other Ambulatory Visit: Payer: Self-pay

## 2022-04-01 ENCOUNTER — Encounter (HOSPITAL_BASED_OUTPATIENT_CLINIC_OR_DEPARTMENT_OTHER): Payer: Self-pay | Admitting: Orthopedic Surgery

## 2022-04-10 ENCOUNTER — Ambulatory Visit (HOSPITAL_BASED_OUTPATIENT_CLINIC_OR_DEPARTMENT_OTHER)
Admission: RE | Admit: 2022-04-10 | Discharge: 2022-04-10 | Disposition: A | Discharge status: Home / Self Care | Payer: Worker's Compensation | Attending: Orthopedic Surgery | Admitting: Orthopedic Surgery

## 2022-04-10 ENCOUNTER — Encounter (HOSPITAL_BASED_OUTPATIENT_CLINIC_OR_DEPARTMENT_OTHER): Payer: Self-pay | Admitting: Orthopedic Surgery

## 2022-04-10 ENCOUNTER — Ambulatory Visit (HOSPITAL_BASED_OUTPATIENT_CLINIC_OR_DEPARTMENT_OTHER): Payer: Worker's Compensation | Admitting: Anesthesiology

## 2022-04-10 ENCOUNTER — Ambulatory Visit (HOSPITAL_BASED_OUTPATIENT_CLINIC_OR_DEPARTMENT_OTHER): Payer: Worker's Compensation

## 2022-04-10 ENCOUNTER — Encounter (HOSPITAL_BASED_OUTPATIENT_CLINIC_OR_DEPARTMENT_OTHER)
Admission: RE | Disposition: A | Discharge status: Home / Self Care | Payer: Self-pay | Source: Home / Self Care | Attending: Orthopedic Surgery

## 2022-04-10 ENCOUNTER — Other Ambulatory Visit: Payer: Self-pay

## 2022-04-10 DIAGNOSIS — M24541 Contracture, right hand: Secondary | ICD-10-CM

## 2022-04-10 HISTORY — PX: DISTAL INTERPHALANGEAL JOINT FUSION: SHX6428

## 2022-04-10 LAB — POCT PREGNANCY, URINE: Preg Test, Ur: NEGATIVE

## 2022-04-10 SURGERY — DISTAL INTERPHALANGEAL JOINT FUSION
Anesthesia: General | Site: Finger | Laterality: Right

## 2022-04-10 MED ORDER — LIDOCAINE HCL (CARDIAC) PF 100 MG/5ML IV SOSY
PREFILLED_SYRINGE | INTRAVENOUS | Status: DC | PRN
  Administered 2022-04-10: 60 mg via INTRAVENOUS

## 2022-04-10 MED ORDER — MIDAZOLAM HCL 2 MG/2ML IJ SOLN
2.0000 mg | Freq: Once | INTRAMUSCULAR | Status: AC
  Administered 2022-04-10: 2 mg via INTRAVENOUS

## 2022-04-10 MED ORDER — FENTANYL CITRATE (PF) 100 MCG/2ML IJ SOLN
INTRAMUSCULAR | Status: AC
  Filled 2022-04-10: qty 2

## 2022-04-10 MED ORDER — FENTANYL CITRATE (PF) 100 MCG/2ML IJ SOLN
100.0000 ug | Freq: Once | INTRAMUSCULAR | Status: AC
  Administered 2022-04-10: 100 ug via INTRAVENOUS

## 2022-04-10 MED ORDER — CEFAZOLIN SODIUM-DEXTROSE 2-4 GM/100ML-% IV SOLN
INTRAVENOUS | Status: AC
  Filled 2022-04-10: qty 100

## 2022-04-10 MED ORDER — FENTANYL CITRATE (PF) 100 MCG/2ML IJ SOLN: 25.0000 ug | INTRAMUSCULAR | Status: DC | PRN

## 2022-04-10 MED ORDER — CEFAZOLIN SODIUM-DEXTROSE 2-4 GM/100ML-% IV SOLN
2.0000 g | INTRAVENOUS | Status: AC
  Administered 2022-04-10: 2 g via INTRAVENOUS

## 2022-04-10 MED ORDER — MIDAZOLAM HCL 2 MG/2ML IJ SOLN
INTRAMUSCULAR | Status: AC
  Filled 2022-04-10: qty 2

## 2022-04-10 MED ORDER — ACETAMINOPHEN 500 MG PO TABS
1000.0000 mg | ORAL_TABLET | Freq: Once | ORAL | Status: AC
  Administered 2022-04-10: 1000 mg via ORAL

## 2022-04-10 MED ORDER — DEXAMETHASONE SODIUM PHOSPHATE 10 MG/ML IJ SOLN
INTRAMUSCULAR | Status: DC | PRN
  Administered 2022-04-10: 5 mg via INTRAVENOUS

## 2022-04-10 MED ORDER — HYDROCODONE-ACETAMINOPHEN 5-325 MG PO TABS: ORAL_TABLET | ORAL | 0 refills | Status: AC

## 2022-04-10 MED ORDER — LACTATED RINGERS IV SOLN: INTRAVENOUS | Status: DC

## 2022-04-10 MED ORDER — ONDANSETRON HCL 4 MG/2ML IJ SOLN
INTRAMUSCULAR | Status: DC | PRN
  Administered 2022-04-10: 4 mg via INTRAVENOUS

## 2022-04-10 MED ORDER — 0.9 % SODIUM CHLORIDE (POUR BTL) OPTIME
TOPICAL | Status: DC | PRN
  Administered 2022-04-10: 1000 mL

## 2022-04-10 MED ORDER — PROPOFOL 10 MG/ML IV BOLUS
INTRAVENOUS | Status: DC | PRN
  Administered 2022-04-10: 120 mg via INTRAVENOUS

## 2022-04-10 MED ORDER — BUPIVACAINE-EPINEPHRINE (PF) 0.5% -1:200000 IJ SOLN
INTRAMUSCULAR | Status: DC | PRN
  Administered 2022-04-10: 25 mL via PERINEURAL

## 2022-04-10 MED ORDER — ACETAMINOPHEN 500 MG PO TABS
ORAL_TABLET | ORAL | Status: AC
  Filled 2022-04-10: qty 2

## 2022-04-10 SURGICAL SUPPLY — 54 items
APL PRP STRL LF DISP 70% ISPRP (MISCELLANEOUS) ×1
BIT DRILL SHORT 1.7 316-0513 (DRILL) ×1 IMPLANT
BLADE MINI RND TIP GREEN BEAV (BLADE) ×2 IMPLANT
BLADE SURG 15 STRL LF DISP TIS (BLADE) ×2 IMPLANT
BLADE SURG 15 STRL SS (BLADE) ×4
BNDG CMPR 9X4 STRL LF SNTH (GAUZE/BANDAGES/DRESSINGS) ×1
BNDG ELASTIC 2X5.8 VLCR STR LF (GAUZE/BANDAGES/DRESSINGS) IMPLANT
BNDG ELASTIC 3X5.8 VLCR STR LF (GAUZE/BANDAGES/DRESSINGS) ×2 IMPLANT
BNDG ESMARK 4X9 LF (GAUZE/BANDAGES/DRESSINGS) ×2 IMPLANT
BNDG GAUZE ELAST 4 BULKY (GAUZE/BANDAGES/DRESSINGS) ×2 IMPLANT
CHLORAPREP W/TINT 26 (MISCELLANEOUS) ×2 IMPLANT
CORD BIPOLAR FORCEPS 12FT (ELECTRODE) ×2 IMPLANT
COVER BACK TABLE 60X90IN (DRAPES) ×2 IMPLANT
COVER MAYO STAND STRL (DRAPES) ×2 IMPLANT
CUFF TOURN SGL QUICK 18X4 (TOURNIQUET CUFF) ×2 IMPLANT
DRAPE EXTREMITY T 121X128X90 (DISPOSABLE) ×2 IMPLANT
DRAPE OEC MINIVIEW 54X84 (DRAPES) ×2 IMPLANT
DRAPE SURG 17X23 STRL (DRAPES) ×2 IMPLANT
GAUZE SPONGE 4X4 12PLY STRL (GAUZE/BANDAGES/DRESSINGS) ×2 IMPLANT
GAUZE XEROFORM 1X8 LF (GAUZE/BANDAGES/DRESSINGS) ×2 IMPLANT
GLOVE BIO SURGEON STRL SZ7.5 (GLOVE) ×2 IMPLANT
GLOVE BIOGEL PI IND STRL 8 (GLOVE) ×1 IMPLANT
GLOVE BIOGEL PI IND STRL 8.5 (GLOVE) ×1 IMPLANT
GLOVE BIOGEL PI INDICATOR 8 (GLOVE) ×1
GLOVE BIOGEL PI INDICATOR 8.5 (GLOVE) ×1
GLOVE SURG ORTHO 8.0 STRL STRW (GLOVE) IMPLANT
GLOVE SURG SS PI 8.5 STRL IVOR (GLOVE)
GLOVE SURG SS PI 8.5 STRL STRW (GLOVE) IMPLANT
GOWN STRL REUS W/ TWL LRG LVL3 (GOWN DISPOSABLE) ×1 IMPLANT
GOWN STRL REUS W/TWL LRG LVL3 (GOWN DISPOSABLE) ×2
GOWN STRL REUS W/TWL XL LVL3 (GOWN DISPOSABLE) ×2 IMPLANT
K-WIRE 0.35X4 (WIRE) ×2
KWIRE 0.35X4 (WIRE) IMPLANT
NS IRRIG 1000ML POUR BTL (IV SOLUTION) ×2 IMPLANT
PACK BASIN DAY SURGERY FS (CUSTOM PROCEDURE TRAY) ×2 IMPLANT
PAD CAST 3X4 CTTN HI CHSV (CAST SUPPLIES) ×1 IMPLANT
PADDING CAST ABS 3INX4YD NS (CAST SUPPLIES)
PADDING CAST ABS 4INX4YD NS (CAST SUPPLIES) ×1
PADDING CAST ABS COTTON 3X4 (CAST SUPPLIES) IMPLANT
PADDING CAST ABS COTTON 4X4 ST (CAST SUPPLIES) ×1 IMPLANT
PADDING CAST COTTON 3X4 STRL (CAST SUPPLIES) ×2
SCREW HEADLESS 2.0X22 (Screw) ×1 IMPLANT
SLEEVE SCD COMPRESS KNEE MED (STOCKING) ×1 IMPLANT
SLING ARM FOAM STRAP LRG (SOFTGOODS) ×1 IMPLANT
SPLINT PLASTER CAST XFAST 3X15 (CAST SUPPLIES) IMPLANT
SPLINT PLASTER XTRA FASTSET 3X (CAST SUPPLIES)
STOCKINETTE 4X48 STRL (DRAPES) ×2 IMPLANT
SUT ETHILON 3 0 PS 1 (SUTURE) IMPLANT
SUT ETHILON 4 0 PS 2 18 (SUTURE) ×1 IMPLANT
SUT MERSILENE 4 0 P 3 (SUTURE) ×1 IMPLANT
SYR BULB EAR ULCER 3OZ GRN STR (SYRINGE) ×2 IMPLANT
SYR CONTROL 10ML LL (SYRINGE) IMPLANT
TOWEL GREEN STERILE FF (TOWEL DISPOSABLE) ×2 IMPLANT
UNDERPAD 30X36 HEAVY ABSORB (UNDERPADS AND DIAPERS) ×2 IMPLANT

## 2022-04-10 NOTE — Discharge Instructions (Addendum)
[facility] Center Instructions ?Hand Surgery ? ?Wound Care: ?Keep your hand elevated above the level of your heart.  Do not allow it to dangle by your side.  Keep the dressing dry and do not remove it unless your doctor advises you to do so.  He will usually change it at the time of your post-op visit.  Moving your fingers is advised to stimulate circulation but will depend on the site of your surgery.  If you have a splint applied, your doctor will advise you regarding movement. ? ?Activity: ?Do not drive or operate machinery today.  Rest today and then you may return to your normal activity and work as indicated by your physician. ? ?Diet:  ?Drink liquids today or eat a light diet.  You may resume a regular diet tomorrow.   ? ?General expectations: ?Pain for two to three days. ?Fingers may become slightly swollen. ? ?Call your doctor if any of the following occur: ?Severe pain not relieved by pain medication. ?Elevated temperature. ?Dressing soaked with blood. ?Inability to move fingers. ?White or bluish color to fingers. ? ?Regional Anesthesia Blocks ? ?1. Numbness or the inability to move the "blocked" extremity may last from 3-48 hours after placement. The length of time depends on the medication injected and your individual response to the medication. If the numbness is not going away after 48 hours, call your surgeon. ? ?2. The extremity that is blocked will need to be protected until the numbness is gone and the  Strength has returned. Because you cannot feel it, you will need to take extra care to avoid injury. Because it may be weak, you may have difficulty moving it or using it. You may not know what position it is in without looking at it while the block is in effect. ? ?3. For blocks in the legs and feet, returning to weight bearing and walking needs to be done carefully. You will need to wait until the numbness is entirely gone and the strength has returned. You should be able to move your leg and foot  normally before you try and bear weight or walk. You will need someone to be with you when you first try to ensure you do not fall and possibly risk injury. ? ?4. Bruising and tenderness at the needle site are common side effects and will resolve in a few days. ? ?5. Persistent numbness or new problems with movement should be communicated to the surgeon or the [facility] [phone] [facility] [phone]).  ? ?No tylenol today until after 5:15 if needed. ? ?

## 2022-04-10 NOTE — Transfer of Care (Signed)
Immediate Anesthesia Transfer of Care Note ? ?Patient: Kristy Turner ? ?Procedure(s) Performed: RIGHT RING FINGER PROXIMAL INTERPHALANGEAL JOINT FUSION (Right: Finger) ? ?Patient Location: PACU ? ?Anesthesia Type:General and Regional ? ?Level of Consciousness: drowsy ? ?Airway & Oxygen Therapy: Patient Spontanous Breathing and Patient connected to nasal cannula oxygen ? ?Post-op Assessment: Report given to RN and Post -op Vital signs reviewed and stable ? ?Post vital signs: Reviewed and stable ? ?Last Vitals:  ?Vitals Value Taken Time  ?BP    ?Temp    ?Pulse 69 04/10/22 1330  ?Resp 18 04/10/22 1329  ?SpO2 97 % 04/10/22 1330  ? ? ?Last Pain:  ?Vitals:  ? 04/10/22 1022  ?TempSrc: Oral  ?PainSc: 0-No pain  ?   ? ?Patients Stated Pain Goal: 5 (04/10/22 1022) ? ?Complications: No notable events documented. ?

## 2022-04-10 NOTE — Progress Notes (Signed)
Assisted Dr. Edmond Fitzgerald with right, supraclavicular, ultrasound guided block. Side rails up, monitors on throughout procedure. See vital signs in flow sheet. Tolerated Procedure well. 

## 2022-04-10 NOTE — H&P (Signed)
?  Kristy Turner is an 46 y.o. female.   ?Chief Complaint: right ring finger pip joint contracture ?HPI: 46 yo female s/p open fracture to right ring finger pip joint treated with open reduction and pinning.  Has resulted in stiffness of joint in extension.  She wishes to have fusion of the joint in a better position to improve function.  In person spanish language interpreter used. ? ?Allergies: No Known Allergies ? ?History reviewed. No pertinent past medical history. ? ?Past Surgical History:  ?Procedure Laterality Date  ? ARTERY AND TENDON REPAIR Right 08/10/2021  ? Procedure: ARTERY AND TENDON REPAIR RING AND SMALL FINGERS;  Surgeon: [name], MD;  Location: [facility] OR;  Service: Orthopedics;  Laterality: Right;  ? I & D EXTREMITY Right 08/10/2021  ? Procedure: IRRIGATION AND DEBRIDEMENT RING AND SMALL FINGERS;  Surgeon: [name], MD;  Location: [facility] OR;  Service: Orthopedics;  Laterality: Right;  ? OPEN REDUCTION INTERNAL FIXATION (ORIF) FINGER WITH RADIAL BONE GRAFT Right 08/10/2021  ? Procedure: OPEN REDUCTION INTERNAL FIXATION (ORIF) RING AND SMALL FINGERS;  Surgeon: [name], MD;  Location: [facility] OR;  Service: Orthopedics;  Laterality: Right;  ? ? ?Family History: ?History reviewed. No pertinent family history. ? ?Social History:  ? reports that she has never smoked. She does not have any smokeless tobacco history on file. She reports that she does not drink alcohol and does not use drugs. ? ?Medications: ?Medications Prior to Admission  ?Medication Sig Dispense Refill  ? aspirin EC 325 MG tablet Take 1 tablet (325 mg total) by mouth daily. 30 tablet 0  ? Multiple Vitamin (MULTIVITAMIN) tablet Take 1 tablet by mouth at bedtime.    ? pregabalin (LYRICA) 50 MG capsule Take 50 mg by mouth 3 (three) times daily.    ? loratadine (CLARITIN) 10 MG tablet Take 10 mg by mouth daily.    ? ? ?Results for orders placed or performed during the hospital encounter of 04/10/22 (from the past 48 hour(s))   ?Pregnancy, urine POC     Status: None  ? Collection Time: 04/10/22 10:03 AM  ?Result Value Ref Range  ? Preg Test, Ur NEGATIVE NEGATIVE  ?  Comment:        ?THE SENSITIVITY OF THIS ?METHODOLOGY IS >24 mIU/mL ?  ? ? ?No results found. ? ? ? ?Blood pressure 119/71, pulse 90, temperature 98.1 ?F (36.7 ?C), temperature source Oral, resp. rate 15, height 5\' 1"  (1.549 m), weight 58.6 kg, last menstrual period 03/22/2022, SpO2 97 %. ? ?General appearance: alert, cooperative, and appears stated age ?Head: Normocephalic, without obvious abnormality, atraumatic ?Neck: supple, symmetrical, trachea midline ?Extremities: Intact sensation and capillary refill all digits.  +epl/fpl/io.  No wounds.  ?Pulses: 2+ and symmetric ?Skin: Skin color, texture, turgor normal. No rashes or lesions ?Neurologic: Grossly normal ?Incision/Wound: none ? ?Assessment/Plan ?Right ring finger extension contracture.  Plan fusion of pip joint in more flexion to allow finger to touch palm to improve function.  Non operative and operative treatment options have been discussed with the patient and patient wishes to proceed with operative treatment. Risks, benefits, and alternatives of surgery have been discussed and the patient agrees with the plan of care.  ? ?05/22/2022 ?04/10/2022, 11:55 AM ? ?

## 2022-04-10 NOTE — Anesthesia Procedure Notes (Signed)
Procedure Name: LMA Insertion ?Date/Time: 04/10/2022 12:21 PM ?Performed by: Burna Cash, CRNA ?Pre-anesthesia Checklist: Patient identified, Emergency Drugs available, Suction available and Patient being monitored ?Patient Re-evaluated:Patient Re-evaluated prior to induction ?Oxygen Delivery Method: Circle system utilized ?Preoxygenation: Pre-oxygenation with 100% oxygen ?Induction Type: IV induction ?Ventilation: Mask ventilation without difficulty ?LMA: LMA inserted ?LMA Size: 3.0 ?Number of attempts: 1 ?Airway Equipment and Method: Bite block ?Placement Confirmation: positive ETCO2 ?Tube secured with: Tape ?Dental Injury: Teeth and Oropharynx as per pre-operative assessment  ? ? ? ? ?

## 2022-04-10 NOTE — Anesthesia Procedure Notes (Signed)
Anesthesia Regional Block: Supraclavicular block  ? ?Pre-Anesthetic Checklist: , timeout performed,  Correct Patient, Correct Site, Correct Laterality,  Correct Procedure, Correct Position, site marked,  Risks and benefits discussed,  Pre-op evaluation,  At surgeon's request and post-op pain management ? ?Laterality: Right ? ?Prep: Maximum Sterile Barrier Precautions used, chloraprep     ?  ?Needles:  ?Injection technique: Single-shot ? ?Needle Type: Echogenic Stimulator Needle   ? ? ?Needle Length: 5cm  ?Needle Gauge: 22  ? ? ? ?Additional Needles: ? ? ?Procedures:,,,, ultrasound used (permanent image in chart),,    ?Narrative:  ?Start time: 04/10/2022 11:17 AM ?End time: 04/10/2022 11:27 AM ?Injection made incrementally with aspirations every 5 mL. ? ?Performed by: Personally  ?Anesthesiologist: Gaynelle Adu, MD ? ? ? ? ?

## 2022-04-10 NOTE — Op Note (Signed)
NAME: Kristy Turner ?MEDICAL RECORD NO: [id number] ?DATE OF BIRTH: 1976-05-25 ?FACILITY: [place] ?LOCATION: [facility] ?PHYSICIAN: Tami Ribas, MD ?  ?OPERATIVE REPORT ?  ?DATE OF PROCEDURE: 04/10/22  ?  ?PREOPERATIVE DIAGNOSIS: Right ring finger PIP extension contracture ?  ?POSTOPERATIVE DIAGNOSIS: Right ring finger PIP extension contracture ?  ?PROCEDURE: Right ring finger PIP joint arthrodesis ?  ?SURGEON:  [name], M.D. ?  ?ASSISTANT: Cindee Salt, MD ?  ?ANESTHESIA:  General with regional ?  ?INTRAVENOUS FLUIDS:  Per anesthesia flow sheet. ?  ?ESTIMATED BLOOD LOSS:  Minimal. ?  ?COMPLICATIONS:  None. ?  ?SPECIMENS:  none ?  ?TOURNIQUET TIME:   ? ?Total Tourniquet Time Documented: ?Upper Arm (Right) - 46 minutes ?Total: Upper Arm (Right) - 46 minutes ? ?  ?DISPOSITION:  Stable to PACU. ?  ?INDICATIONS: 46 year old female status post injury to the right ring finger with open proximal phalanx intra-articular fracture.  She has had extension contracture of the finger.  She wishes to have fusion of the right ring finger PIP joint in a better position to improve her function.  Risks, benefits and alternatives of surgery were discussed including the risks of blood loss, infection, damage to nerves, vessels, tendons, ligaments, bone for surgery, need for additional surgery, complications with wound healing, continued pain, stiffness, , nonunion, malunion.  She voiced understanding of these risks and elected to proceed. ? ?OPERATIVE COURSE:  After being identified preoperatively by myself,  the patient and I agreed on the procedure and site of the procedure.  The surgical site was marked.  Surgical consent had been signed. Preoperative IV antibiotic prophylaxis was given. She was transferred to the operating room and placed on the operating table in supine position with the Right upper extremity on an arm board.  General anesthesia was induced by the anesthesiologist. A regional block  had been performed by anesthesia in preoperative holding.    Right upper extremity was prepped and draped in normal sterile orthopedic fashion.  A surgical pause was performed between the surgeons, anesthesia, and operating room staff and all were in agreement as to the patient, procedure, and site of procedure.  Tourniquet at the proximal aspect of the extremity was inflated to 250 mmHg after exsanguination of the arm with an Esmarch bandage.  Incision was made longitudinally at the dorsum of the ring finger over the PIP joint.  This was carried in subcutaneous tissues by spreading technique.  Bipolar electrocautery was used to obtain hemostasis.  The extensor tendon was split longitudinally.  It was elevated from the bone with the knife and elevators.  There was significant scar holding it down.  The PIP joint was identified.  It was scarred and fibrotic.  The knife was used to release the dorsal capsule that remained.  The Beaver blade was used to release the collateral ligaments.  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  This was removed with the rongeurs.  The joint was able to be accessed.  The rongeurs were used to take down the remaining proximal phalangeal head to good medullary bone.  The middle phalanx base was rongeured down to medullary bone.  The joint was able to be reduced at approximately 45 to 50 degrees of flexion.  A guidepin from the OsteoMed fusion site was placed from the dorsum of the proximal phalanx across the fusion site into the canal of the middle phalanx.  The C-arm was used in AP and lateral projections  to ensure appropriate position of the PIP joint for fusion and of the guidepin.  The drill was used over top of the guidepin.  A 22 mm screw was placed over top of the guidepin.  Good purchase was obtained.  There was good compression at the fusion site.  C-arm was used in AP lateral and oblique projections to ensure appropriate position of the arthrodesis site  and position of the hardware which was the case.  The wound was copiously irrigated with sterile saline.  The extensor tendon was repaired back over top of the bone and PIP joint with a running 4-0 Mersilene suture.  The skin was closed with 4-0 nylon in a horizontal mattress fashion.  The wound was dressed with sterile Xeroform and 4 x 4's and wrapped with a Kerlix bandage.  A volar splint was placed including the long ring and small fingers.  This was wrapped with Kerlix and Ace bandage.  The tourniquet was deflated at 46 minutes.  Fingertips were pink with brisk capillary refill after deflation of tourniquet.  The operative  drapes were broken down.  The patient was awoken from anesthesia safely.  She was transferred back to the stretcher and taken to PACU in stable condition.  I will see her back in the office in 1 week for postoperative followup.  I will give her a prescription for Norco 5/325 1-2 tabs PO q6 hours prn pain, dispense # 25. ? ? ?[name], MD ?Electronically signed, 04/10/22 ?

## 2022-04-10 NOTE — Anesthesia Preprocedure Evaluation (Addendum)
Anesthesia Evaluation  ?Patient identified by MRN, date of birth, ID band ?Patient awake ? ? ? ?Reviewed: ?Allergy & Precautions, H&P , NPO status , Patient's Chart, lab work & pertinent test results ? ?Airway ?Mallampati: II ? ?TM Distance: >3 FB ?Neck ROM: Full ? ? ? Dental ?no notable dental hx. ?(+) Teeth Intact, Dental Advisory Given ?  ?Pulmonary ?neg pulmonary ROS,  ?  ?Pulmonary exam normal ?breath sounds clear to auscultation ? ? ? ? ? ? Cardiovascular ?negative cardio ROS ? ? ?Rhythm:Regular Rate:Normal ? ? ?  ?Neuro/Psych ?negative neurological ROS ? negative psych ROS  ? GI/Hepatic ?negative GI ROS, Neg liver ROS,   ?Endo/Other  ?negative endocrine ROS ? Renal/GU ?negative Renal ROS  ?negative genitourinary ?  ?Musculoskeletal ? ? Abdominal ?  ?Peds ? Hematology ?negative hematology ROS ?(+)   ?Anesthesia Other Findings ? ? Reproductive/Obstetrics ?negative OB ROS ? ?  ? ? ? ? ? ? ? ? ? ? ? ? ? ?  ?  ? ? ? ? ? ? ? ?Anesthesia Physical ?Anesthesia Plan ? ?ASA: 1 ? ?Anesthesia Plan: General  ? ?Post-op Pain Management: Tylenol PO (pre-op)* and Toradol IV (intra-op)*  ? ?Induction: Intravenous ? ?PONV Risk Score and Plan: 4 or greater and Ondansetron, Dexamethasone and Midazolam ? ?Airway Management Planned: LMA ? ?Additional Equipment:  ? ?Intra-op Plan:  ? ?Post-operative Plan: Extubation in OR ? ?Informed Consent: I have reviewed the patients History and Physical, chart, labs and discussed the procedure including the risks, benefits and alternatives for the proposed anesthesia with the patient or authorized representative who has indicated his/her understanding and acceptance.  ? ? ? ?Dental advisory given ? ?Plan Discussed with: CRNA ? ?Anesthesia Plan Comments:   ? ? ? ? ? ? ?Anesthesia Quick Evaluation ? ?

## 2022-04-10 NOTE — Op Note (Signed)
I assisted Surgeon(s) and Role: ?   [name], MD - Primary ?   Cindee Salt, MD - Assisting on the Procedure(s): ?RIGHT RING FINGER PROXIMAL INTERPHALANGEAL JOINT FUSION on 04/10/2022.  I provided assistance on this case as follows: Set up, approach, isolation of the PIP joint, preparation of the proximal and middle phalangeal ocular surfaces by placement of a guide pin, splint of the osseous fixation, repair of the extensor tendon and closure of the wound followed by application of the dressing and splint. ? ?Electronically signed by: Cindee Salt, MD ?Date: 04/10/2022 Time: 1:24 PM  ?

## 2022-04-10 NOTE — Anesthesia Postprocedure Evaluation (Signed)
Anesthesia Post Note ? ?Patient: Kristy Turner ? ?Procedure(s) Performed: RIGHT RING FINGER PROXIMAL INTERPHALANGEAL JOINT FUSION (Right: Finger) ? ?  ? ?Patient location during evaluation: PACU ?Anesthesia Type: General and Regional ?Level of consciousness: awake and alert ?Pain management: pain level controlled ?Vital Signs Assessment: post-procedure vital signs reviewed and stable ?Respiratory status: spontaneous breathing, nonlabored ventilation and respiratory function stable ?Cardiovascular status: blood pressure returned to baseline and stable ?Postop Assessment: no apparent nausea or vomiting ?Anesthetic complications: no ? ? ?No notable events documented. ? ?Last Vitals:  ?Vitals:  ? 04/10/22 1345 04/10/22 1425  ?BP: 110/67 121/72  ?Pulse: 84 85  ?Resp: 14 18  ?Temp:  36.8 ?C  ?SpO2: 96% 98%  ?  ?Last Pain:  ?Vitals:  ? 04/10/22 1425  ?TempSrc: Oral  ?PainSc: 0-No pain  ? ? ?  ?  ?  ?  ?  ?  ? ?Donta Mcinroy,W. EDMOND ? ? ? ? ?

## 2022-04-14 ENCOUNTER — Encounter (HOSPITAL_BASED_OUTPATIENT_CLINIC_OR_DEPARTMENT_OTHER): Payer: Self-pay | Admitting: Orthopedic Surgery

## 2022-06-04 IMAGING — DX DG HAND COMPLETE 3+V*R*
1 series · 3 of 3 positions shown · non-contrast
Comparison: None.

CLINICAL DATA: Injury to the fourth and fifth fingers. Fingers
caught in a tortilla machine.

EXAM:
RIGHT HAND - COMPLETE 3+ VIEW

[Series 1: hand · 0.14mm/px · 3 of 3 slices shown]
[im 1/3]
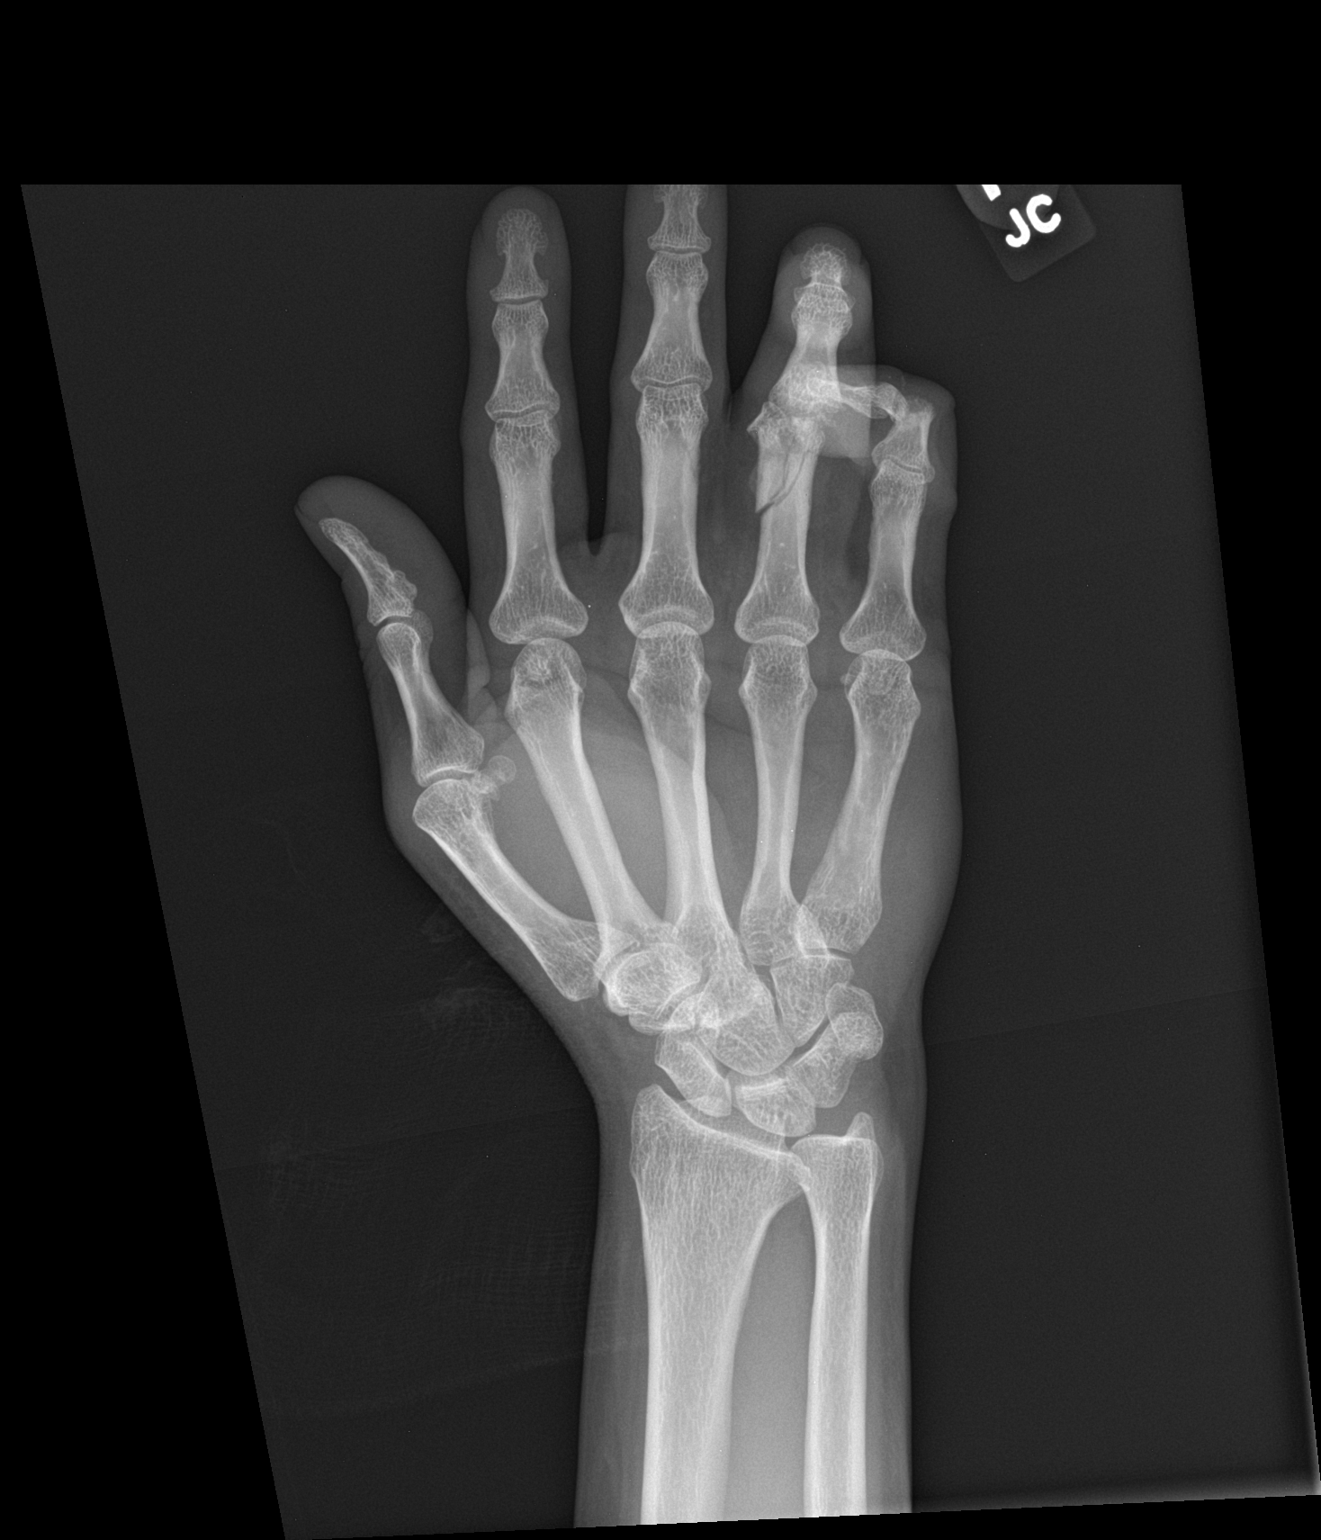
[im 2/3]
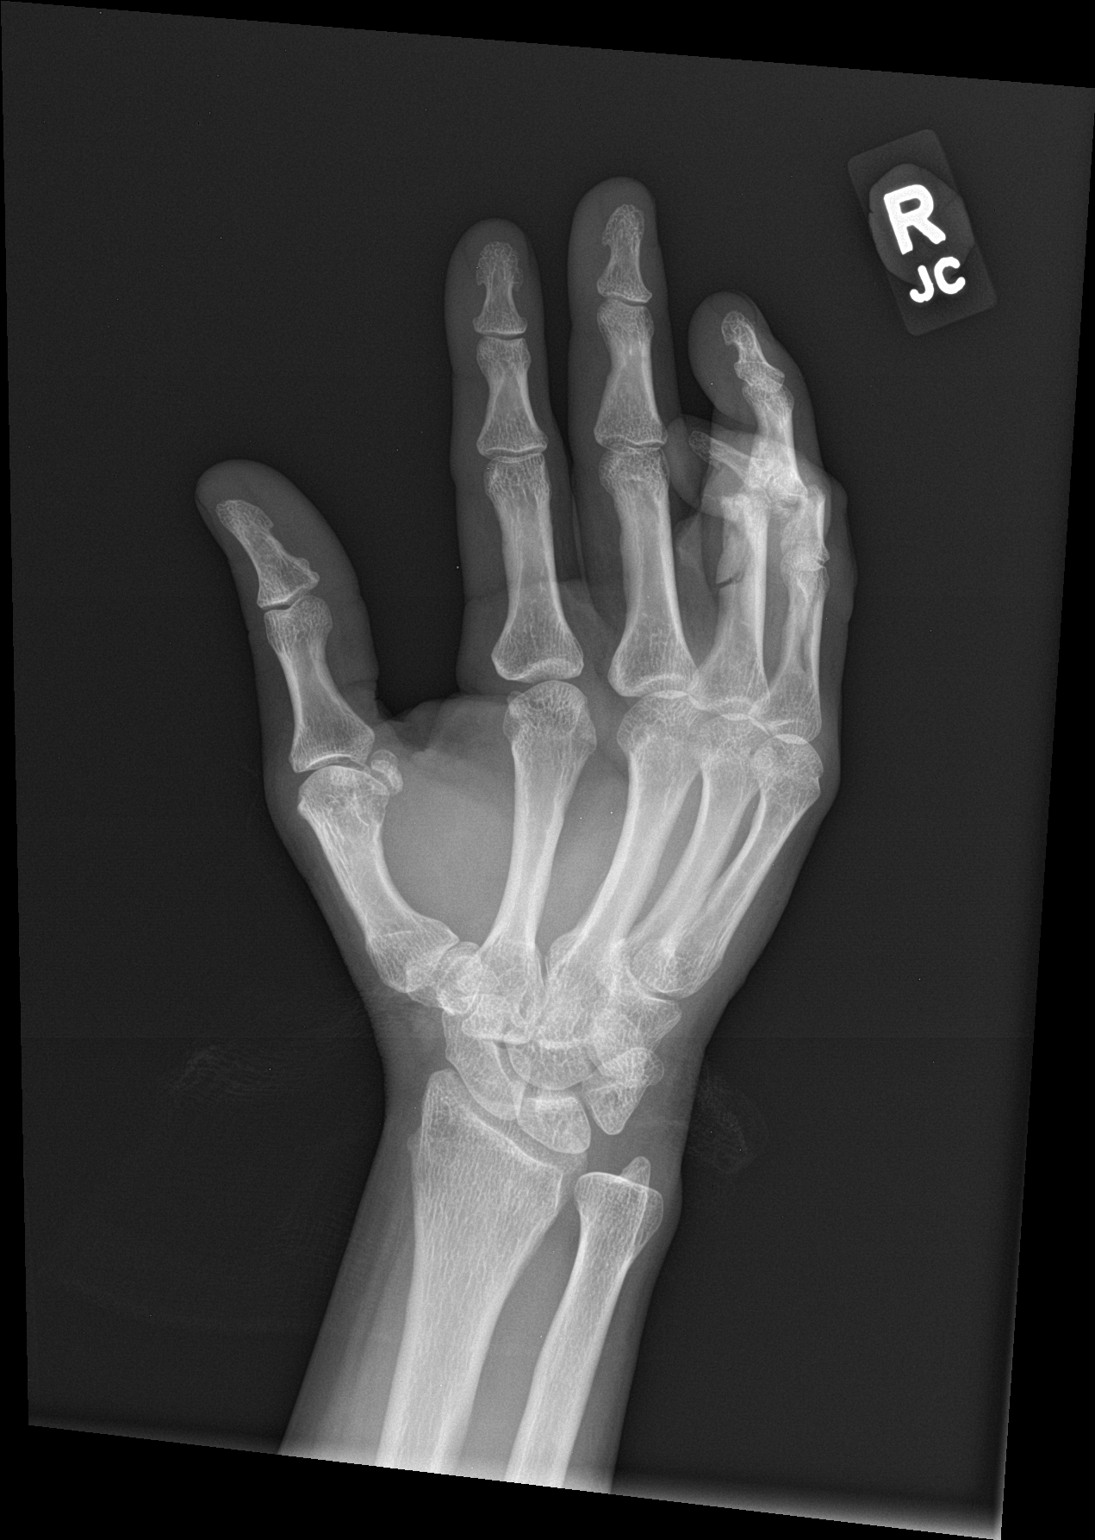
[im 3/3]
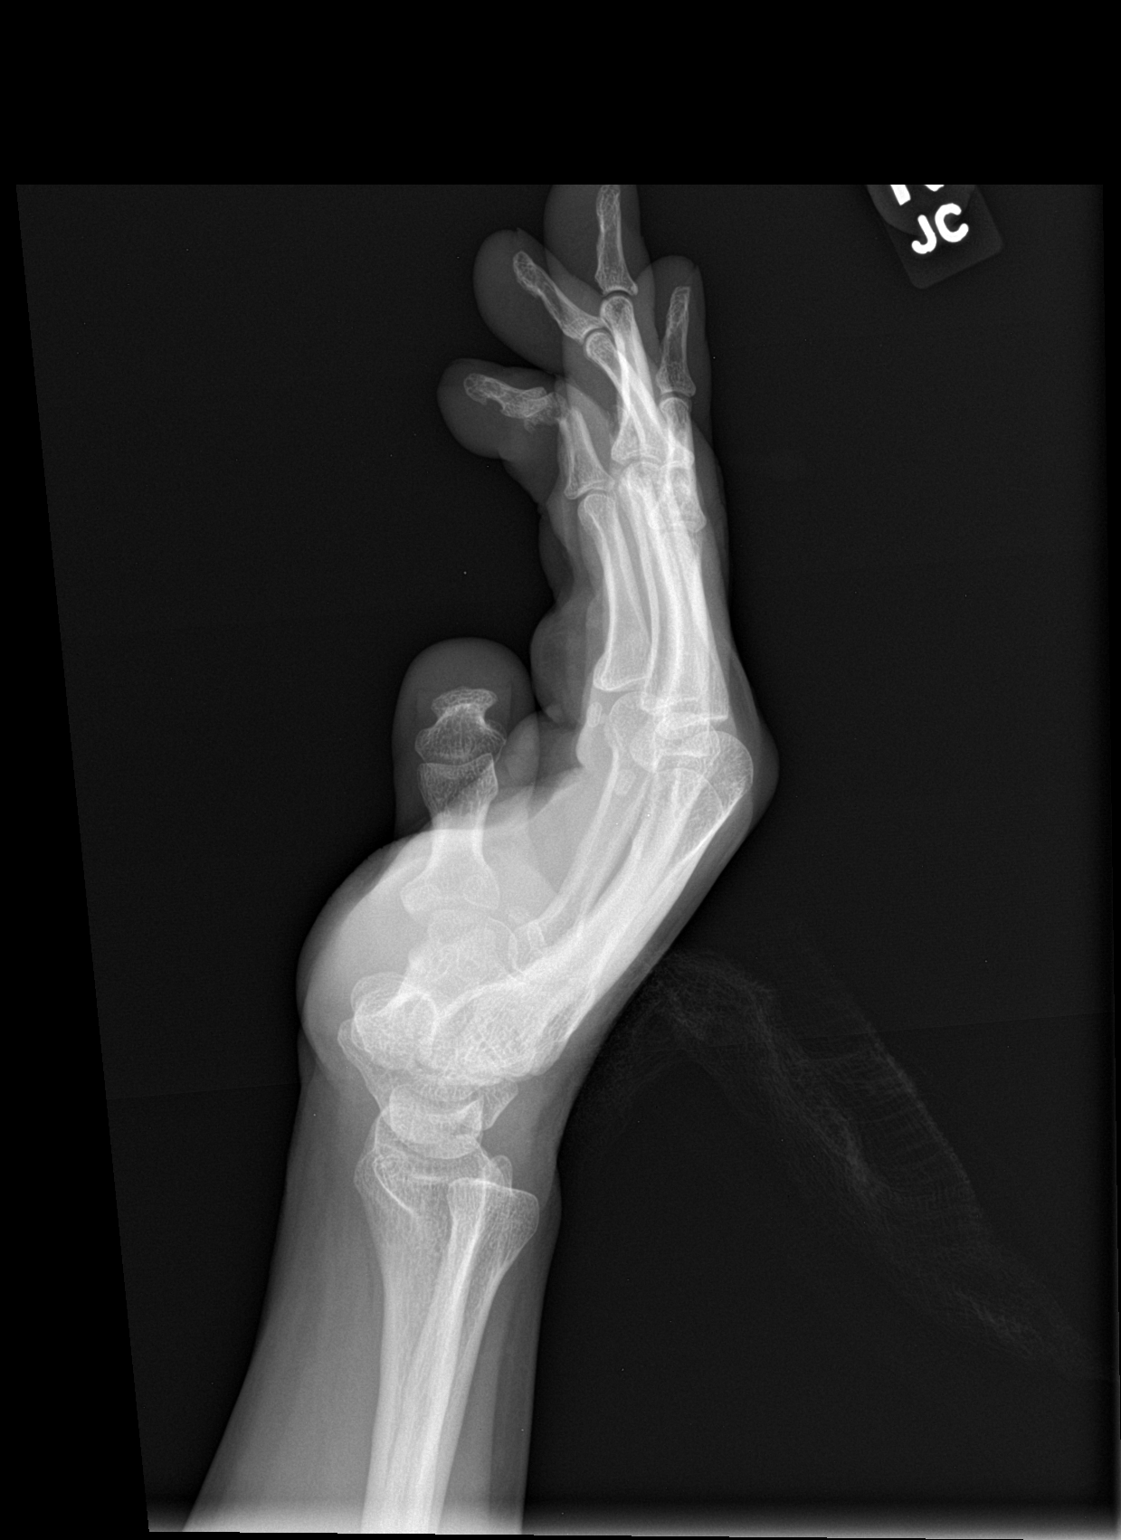

[3 of 3 positions shown; findings below may reference images not displayed]

FINDINGS: Comminuted fractures of the distal proximal phalanx of the right
fourth finger with extension to the proximal interphalangeal joint
surface and dislocation of the proximal interphalangeal joint with
complete dorsal displacement of the middle phalanx with respect to
the proximal phalanx. Articular fractures are also demonstrated in
the proximal aspect of the middle phalanx.

Displaced fractures of the middle phalanx of the right fifth finger
with flexion deformity, likely indicating ligamentous injury as
well. Associated soft tissue swelling. No radiopaque foreign bodies
are demonstrated.
IMPRESSION: Comminuted and displaced fracture dislocations of the right fourth
finger involving the proximal interphalangeal joint and of the right
fifth finger involving the middle phalanx.
